# Patient Record
Sex: Female | Born: 1995 | Hispanic: Yes | Marital: Married | Smoking: Never smoker
Health system: Southern US, Community
[De-identification: ages and names within clinical notes are randomized; demographics above are authoritative.]

## PROBLEM LIST (undated history)

## (undated) ENCOUNTER — Inpatient Hospital Stay (HOSPITAL_COMMUNITY): Payer: Self-pay

## (undated) DIAGNOSIS — O139 Gestational [pregnancy-induced] hypertension without significant proteinuria, unspecified trimester: Secondary | ICD-10-CM

## (undated) DIAGNOSIS — D649 Anemia, unspecified: Secondary | ICD-10-CM

## (undated) DIAGNOSIS — Z5189 Encounter for other specified aftercare: Secondary | ICD-10-CM

## (undated) HISTORY — PX: SKIN GRAFT: SHX250

## (undated) HISTORY — PX: FRACTURE SURGERY: SHX138

---
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
## 2014-05-12 ENCOUNTER — Other Ambulatory Visit (HOSPITAL_COMMUNITY): Payer: Self-pay | Admitting: Nurse Practitioner

## 2014-05-12 DIAGNOSIS — Z3689 Encounter for other specified antenatal screening: Secondary | ICD-10-CM

## 2014-05-12 LAB — OB RESULTS CONSOLE HIV ANTIBODY (ROUTINE TESTING): HIV: NONREACTIVE

## 2014-05-12 LAB — OB RESULTS CONSOLE ABO/RH: RH Type: POSITIVE

## 2014-05-12 LAB — OB RESULTS CONSOLE HEPATITIS B SURFACE ANTIGEN: Hepatitis B Surface Ag: NEGATIVE

## 2014-05-12 LAB — OB RESULTS CONSOLE GC/CHLAMYDIA
Chlamydia: NEGATIVE
GC PROBE AMP, GENITAL: NEGATIVE

## 2014-05-12 LAB — OB RESULTS CONSOLE RUBELLA ANTIBODY, IGM: Rubella: IMMUNE

## 2014-05-12 LAB — OB RESULTS CONSOLE RPR: RPR: NONREACTIVE

## 2014-05-12 LAB — OB RESULTS CONSOLE ANTIBODY SCREEN: Antibody Screen: NEGATIVE

## 2014-06-16 ENCOUNTER — Ambulatory Visit (HOSPITAL_COMMUNITY)
Admission: RE | Admit: 2014-06-16 | Discharge: 2014-06-16 | Disposition: A | Discharge status: Home / Self Care | Payer: Medicaid Other | Source: Ambulatory Visit | Attending: Nurse Practitioner | Admitting: Nurse Practitioner

## 2014-06-16 ENCOUNTER — Other Ambulatory Visit (HOSPITAL_COMMUNITY): Payer: Self-pay | Admitting: Nurse Practitioner

## 2014-06-16 DIAGNOSIS — IMO0001 Reserved for inherently not codable concepts without codable children: Secondary | ICD-10-CM

## 2014-06-16 DIAGNOSIS — IMO0002 Reserved for concepts with insufficient information to code with codable children: Secondary | ICD-10-CM

## 2014-06-16 DIAGNOSIS — Z3689 Encounter for other specified antenatal screening: Secondary | ICD-10-CM

## 2014-06-16 DIAGNOSIS — O283 Abnormal ultrasonic finding on antenatal screening of mother: Secondary | ICD-10-CM

## 2014-07-28 ENCOUNTER — Encounter (HOSPITAL_COMMUNITY): Payer: Self-pay

## 2014-07-28 ENCOUNTER — Other Ambulatory Visit (HOSPITAL_COMMUNITY): Payer: Self-pay | Admitting: Physician Assistant

## 2014-07-28 ENCOUNTER — Ambulatory Visit (HOSPITAL_COMMUNITY)
Admission: RE | Admit: 2014-07-28 | Discharge: 2014-07-28 | Disposition: A | Discharge status: Home / Self Care | Payer: Medicaid Other | Source: Ambulatory Visit | Attending: Physician Assistant | Admitting: Physician Assistant

## 2014-07-28 ENCOUNTER — Ambulatory Visit (HOSPITAL_COMMUNITY)
Admission: RE | Admit: 2014-07-28 | Discharge: 2014-07-28 | Disposition: A | Discharge status: Home / Self Care | Payer: Medicaid Other | Source: Ambulatory Visit | Attending: Obstetrics and Gynecology | Admitting: Obstetrics and Gynecology

## 2014-07-28 VITALS — BP 124/68 | HR 99 | Wt 130.2 lb

## 2014-07-28 DIAGNOSIS — O283 Abnormal ultrasonic finding on antenatal screening of mother: Secondary | ICD-10-CM | POA: Insufficient documentation

## 2014-07-28 DIAGNOSIS — Z3A25 25 weeks gestation of pregnancy: Secondary | ICD-10-CM | POA: Insufficient documentation

## 2014-07-28 DIAGNOSIS — O10919 Unspecified pre-existing hypertension complicating pregnancy, unspecified trimester: Secondary | ICD-10-CM | POA: Insufficient documentation

## 2014-07-28 DIAGNOSIS — Z315 Encounter for genetic counseling: Secondary | ICD-10-CM | POA: Diagnosis not present

## 2014-07-28 DIAGNOSIS — O162 Unspecified maternal hypertension, second trimester: Secondary | ICD-10-CM | POA: Insufficient documentation

## 2014-07-28 DIAGNOSIS — O10912 Unspecified pre-existing hypertension complicating pregnancy, second trimester: Secondary | ICD-10-CM

## 2014-07-28 DIAGNOSIS — O358XX0 Maternal care for other (suspected) fetal abnormality and damage, not applicable or unspecified: Secondary | ICD-10-CM | POA: Insufficient documentation

## 2014-08-01 NOTE — Progress Notes (Signed)
Genetic Counseling  High-Risk Gestation Note  Appointment Date:  07/28/2014 Referred By: Quentin Mullingriplett, John J, PA-C Date of Birth:  03/07/1996 Partner:  Adan SisJose Villegas   Pregnancy History: [id number] Estimated Date of Delivery: 11/20/14 Estimated Gestational Age: 7328w4d Attending: Alpha GulaPaul Whitecar, MD   Ms. Chelsey Serfass and her partner, Mr. Adan SisJose Villegas, were seen for genetic counseling because of abnormal ultrasound findings.    Ms. Ervin KnackCardoza previously had ultrasound [facility] on 06/16/14.  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  Ultrasound report is under separate cover.  Follow-up ultrasound was performed today and the previously visualized markers were not appreciated at the time of today's exam. Complete ultrasound results reported separately.    We discussed that the second trimester genetic sonogram is targeted at identifying features associated with aneuploidy.  It has evolved as a screening tool used to provide an individualized risk assessment for Down syndrome and other trisomies.  The ability of sonography to aid in the detection of aneuploidies relies on identification of both major structural anomalies and "soft markers."  The patient was counseled that the latter term refers to findings that are often normal variants and do not cause any significant medical problems.  Nonetheless, these markers have a known association with aneuploidy.    We discussed that an echogenic bowel refers to an area in the fetal intestines that appears brighter, or denser, than the liver and/or bone on ultrasound. Echogenic bowel is detected in approximately 1% of fetuses at the time of a second trimester ultrasound evaluation.  Most of the time, this brightness does not cause any problems and will spontaneously resolve.  Possible causes of an echogenic bowel include undergrowth of the bowel, an obstruction or blockage of the intestines, the fetus  swallowing a small amount of blood present in the amniotic fluid, an intrauterine infection, a chromosome condition, cystic fibrosis, or a normal variation in development.   Ms. Ervin KnackCardoza reported no bleeding in the pregnancy. She also denied known exposure to infections during the pregnancy. Maternal TORCH titers were drawn at [facility] today and results are currently pending. We briefly reviewed cystic fibrosis (CF) including the typical features and prognosis and the autosomal recessive inheritance of the condition. The carrier frequency in the Hispanic population is approximately 1 in 10346.  The presence of echogenic bowel on prenatal ultrasound is associated with an increased risk for CF. Ms. Daiva NakayamaCardoza's OB medical records indicate that she previously had negative/normal CF carrier screening through her OB office. We reviewed that a normal screen reduces but does not eliminate the chance to be a carrier.  Thus, her risk to be a carrier has been reduced to approximately 1 in 203, and the risk for CF in the pregnancy has been significantly reduced.   We discussed chromosomes, nondisjunction, and examples of chromosome conditions including Down syndrome (trisomy 1821) and trisomy 6218. Ms. Ervin KnackCardoza previously had Quad screen performed through [facility], which was within normal range. We discussed that this screen reduced the risks for fetal Down syndrome (1 in 6,005), trisomy 18 (< 1 in 10,000), and open neural tube defects. They understand that this screen adjusts the a priori risk for these conditions to provide pregnancy specific risk assessment but is not diagnostic for these conditions. We discussed that the presence of echogenic bowel on ultrasound increases the chance for fetal Down syndrome from the patient's Quad screen result of 1 in 6,005 to approximately 1 in 1000.  The couple was counseled that the choroid plexus is an area in the brain where cerebral spinal fluid, the fluid that bathes  the brain and spinal cord, is made.  Cysts, or fluid filled sacs, are sometimes found in the choroid plexus of babies both before and after they are born.  We discussed that approximately 1% of pregnancies evaluated by ultrasound will show choroid plexus cyst (CPCs).  Literature suggests that CPCs are an ultrasound finding in approximately 30-50% of fetuses with trisomy 18, but are an isolated finding in less than 10% of fetuses with trisomy 74.  Ms. Mcguirt was counseled that when a patient has other risk factors for fetal trisomy 81 (abnormal First trimester or quad screening, advanced maternal age, or another ultrasound finding), CPCs are associated with an increased risk (LR of 9) for trisomy 22.  Newer literature suggests that in the absence of other risk factors, CPCs are likely a normal variation of development or a benign finding.  CPCs are not associated with an increased risk for fetal Down syndrome. Considering her maternal age of 18 y.o., her otherwise normal fetal anatomy ultrasound, and her normal Quad screen result, Ms. Heaton risk for fetal trisomy 57 is not expected to be increased above her screen adjusted risk.  However, additional testing options for detection of fetal trisomy 34 were discussed.    We reviewed other available screening and diagnostic options including noninvasive prenatal screening (NIPS)/cell free DNA (cfDNA) testing, and amniocentesis.  She was counseled regarding the benefits and limitations of each option. We discussed the methodology of NIPS, the conditions for which it screens, and the detection rates and false positive rates for these conditions. We reviewed the approximate 1 in 300-500 risk for complications for amniocentesis, including spontaneous pregnancy loss. We discussed that the risk for fetal aneuploidy is lower than the associated risk for complications from amniocentesis. After consideration of all the options, she declined additional screening and testing  for fetal aneuploidy (including declining both NIPS and amniocentesis).   Ms. Varsha Knock was provided with written information regarding sickle cell anemia (SCA) including the carrier frequency and incidence in the Hispanic population, the availability of carrier testing and prenatal diagnosis if indicated.  In addition, we discussed that hemoglobinopathies are routinely screened for as part of the [place] newborn screening panel.  She previously had screening through her OB on 05/12/14, which was reported to be normal.  Both family histories were reviewed and found to be contributory for intellectual disability. The patient reported two maternal first cousins once removed (her maternal grandmother's sister's children) with intellectual disability. These relatives are brother and sister to each other. Additionally, these cousins have another sister, who reportedly does not have intellectual disability but has one son with intellectual disability; the sister's other son and two daughters do not have intellectual disability. The patient had limited information regarding these relatives. She reported that the older relatives are able to work but do not live independently. The patient reported that the father of these cousins (who is reportedly not related to Ms. Ricke) is also mentally "slow." The underlying cause for their intellectual disability is not known.   Ms. Lovejoy was counseled that there are many different causes of intellectual disabilities including environmental, multifactorial, and genetic etiologies.  We discussed that a specific diagnosis for intellectual disability can be determined in approximately 50% of these individuals.  In the remaining 50% of individuals, a diagnosis may never be determined.  Regarding genetic causes, we discussed that chromosome  aberrations (aneuploidy, deletions, duplications, insertions, and translocations) are responsible for a small percentage of individuals with  intellectual disability.  Many individuals with chromosome aberrations have additional differences, including congenital anomalies or minor dysmorphisms.  Likewise, single gene conditions are the underlying cause of intellectual delay in some families.  We discussed that many gene conditions have intellectual disability as a feature, but also often include other physical or medical differences.  Specifically, we reviewed fragile X syndrome and the X-linked inheritance of this condition.  We discussed the option of FMR1 (the gene that when altered causes fragile X syndrome) analysis to determine whether Fragile X syndrome is the cause of intellectual disability in this family.  Ms. Ervin KnackCardoza declined FMR1 analysis today. We discussed that without more specific information, it is difficult to provide an accurate risk assessment.  Further genetic counseling is warranted if more information is obtained.   Additionally, Mr. Meda KlinefelterVillegas reported two double first cousins once removed with congenital abnormal cell growth. These individuals are both females and are first cousins to each other. The three year old reportedly has a growth on her lip which was present at birth, and the 76 months old reportedly has a growth on her leg. Limited information was known regarding the specific etiology for the growth or type of growth. We discussed that this description is not necessarily suggestive of a particular genetic syndrome. However, additional information is needed to accurately assess recurrence risk for relatives.  Without further information regarding the provided family history, an accurate genetic risk cannot be calculated. Further genetic counseling is warranted if more information is obtained.  Ms. Donzetta KohutSelene Slayden denied exposure to environmental toxins or chemical agents. She denied the use of alcohol, tobacco or street drugs. She denied significant viral illnesses during the course of her pregnancy. Her medical and  surgical histories were noncontributory.   I counseled this couple regarding the above risks and available options.  The approximate face-to-face time with the genetic counselor was 40 minutes.  Quinn PlowmanKaren Sakari Alkhatib, MS,  Certified Genetic Counselor 08/01/2014

## 2014-08-02 ENCOUNTER — Other Ambulatory Visit (HOSPITAL_COMMUNITY): Payer: Self-pay

## 2014-08-03 ENCOUNTER — Ambulatory Visit (HOSPITAL_COMMUNITY): Payer: Medicaid Other

## 2014-08-05 ENCOUNTER — Other Ambulatory Visit (HOSPITAL_COMMUNITY): Payer: Self-pay

## 2014-09-09 NOTE — L&D Delivery Note (Signed)
Delivery Note At 10:00 PM a viable female was delivered via Vaginal, Spontaneous Delivery (Presentation: ; Occiput Anterior).  APGAR: 9, 9; weight  . pending  Placenta status: Intact, Spontaneous.  Cord: 3 vessels with the following complications: None.  Cord pH: na  Anesthesia: None  Episiotomy: None Lacerations: None Suture Repair:  Est. Blood Loss (mL):  300  Mom to postpartum.  Baby to Couplet care / Skin to Skin. Uterus hypotonic given methergine 0.2 mg IM  Bethany Cruz,Bethany Cruz 11/23/2014, 10:18 PM

## 2014-09-12 ENCOUNTER — Ambulatory Visit (HOSPITAL_COMMUNITY): Admission: RE | Admit: 2014-09-12 | Payer: Medicaid Other | Source: Ambulatory Visit

## 2014-09-27 ENCOUNTER — Other Ambulatory Visit (HOSPITAL_COMMUNITY): Payer: Self-pay

## 2014-09-27 ENCOUNTER — Encounter (HOSPITAL_COMMUNITY): Payer: Self-pay

## 2014-09-27 ENCOUNTER — Ambulatory Visit (HOSPITAL_COMMUNITY)
Admission: RE | Admit: 2014-09-27 | Discharge: 2014-09-27 | Disposition: A | Discharge status: Home / Self Care | Payer: Medicaid Other | Source: Ambulatory Visit | Attending: Physician Assistant | Admitting: Physician Assistant

## 2014-09-27 DIAGNOSIS — O10912 Unspecified pre-existing hypertension complicating pregnancy, second trimester: Secondary | ICD-10-CM

## 2014-09-27 DIAGNOSIS — Z3A32 32 weeks gestation of pregnancy: Secondary | ICD-10-CM | POA: Insufficient documentation

## 2014-09-27 DIAGNOSIS — O163 Unspecified maternal hypertension, third trimester: Secondary | ICD-10-CM | POA: Diagnosis present

## 2014-09-27 DIAGNOSIS — O350XX0 Maternal care for (suspected) central nervous system malformation in fetus, not applicable or unspecified: Secondary | ICD-10-CM | POA: Insufficient documentation

## 2014-09-27 DIAGNOSIS — O283 Abnormal ultrasonic finding on antenatal screening of mother: Secondary | ICD-10-CM

## 2014-09-27 DIAGNOSIS — O10913 Unspecified pre-existing hypertension complicating pregnancy, third trimester: Secondary | ICD-10-CM | POA: Insufficient documentation

## 2014-09-27 DIAGNOSIS — O3503X0 Maternal care for (suspected) central nervous system malformation or damage in fetus, choroid plexus cysts, not applicable or unspecified: Secondary | ICD-10-CM | POA: Insufficient documentation

## 2014-10-28 LAB — OB RESULTS CONSOLE GBS: GBS: POSITIVE

## 2014-11-03 ENCOUNTER — Other Ambulatory Visit (HOSPITAL_COMMUNITY): Payer: Self-pay | Admitting: Urology

## 2014-11-03 DIAGNOSIS — IMO0002 Reserved for concepts with insufficient information to code with codable children: Secondary | ICD-10-CM

## 2014-11-04 ENCOUNTER — Ambulatory Visit (HOSPITAL_COMMUNITY)
Admission: RE | Admit: 2014-11-04 | Discharge: 2014-11-04 | Disposition: A | Discharge status: Home / Self Care | Payer: Medicaid Other | Source: Ambulatory Visit | Attending: Urology | Admitting: Urology

## 2014-11-04 ENCOUNTER — Ambulatory Visit (HOSPITAL_COMMUNITY): Admission: RE | Admit: 2014-11-04 | Payer: Medicaid Other | Source: Ambulatory Visit

## 2014-11-04 DIAGNOSIS — Z36 Encounter for antenatal screening of mother: Secondary | ICD-10-CM | POA: Diagnosis not present

## 2014-11-04 DIAGNOSIS — O10013 Pre-existing essential hypertension complicating pregnancy, third trimester: Secondary | ICD-10-CM | POA: Insufficient documentation

## 2014-11-04 DIAGNOSIS — Z3A37 37 weeks gestation of pregnancy: Secondary | ICD-10-CM | POA: Diagnosis not present

## 2014-11-04 DIAGNOSIS — O10919 Unspecified pre-existing hypertension complicating pregnancy, unspecified trimester: Secondary | ICD-10-CM | POA: Insufficient documentation

## 2014-11-04 DIAGNOSIS — IMO0002 Reserved for concepts with insufficient information to code with codable children: Secondary | ICD-10-CM

## 2014-11-21 ENCOUNTER — Other Ambulatory Visit (HOSPITAL_COMMUNITY): Payer: Self-pay | Admitting: Physician Assistant

## 2014-11-21 DIAGNOSIS — Z3A40 40 weeks gestation of pregnancy: Secondary | ICD-10-CM

## 2014-11-21 DIAGNOSIS — O10013 Pre-existing essential hypertension complicating pregnancy, third trimester: Secondary | ICD-10-CM

## 2014-11-23 ENCOUNTER — Encounter (HOSPITAL_COMMUNITY): Payer: Self-pay

## 2014-11-23 ENCOUNTER — Encounter (HOSPITAL_COMMUNITY): Payer: Self-pay | Admitting: *Deleted

## 2014-11-23 ENCOUNTER — Telehealth (HOSPITAL_COMMUNITY): Payer: Self-pay | Admitting: *Deleted

## 2014-11-23 ENCOUNTER — Inpatient Hospital Stay (HOSPITAL_COMMUNITY)
Admission: AD | Admit: 2014-11-23 | Discharge: 2014-11-25 | DRG: 775 | Disposition: A | Discharge status: Home / Self Care | Payer: Medicaid Other | Source: Ambulatory Visit | Attending: Obstetrics & Gynecology | Admitting: Obstetrics & Gynecology

## 2014-11-23 DIAGNOSIS — Z3A40 40 weeks gestation of pregnancy: Secondary | ICD-10-CM | POA: Diagnosis present

## 2014-11-23 DIAGNOSIS — O99824 Streptococcus B carrier state complicating childbirth: Secondary | ICD-10-CM | POA: Diagnosis present

## 2014-11-23 DIAGNOSIS — Z3483 Encounter for supervision of other normal pregnancy, third trimester: Secondary | ICD-10-CM | POA: Diagnosis present

## 2014-11-23 DIAGNOSIS — O480 Post-term pregnancy: Principal | ICD-10-CM | POA: Diagnosis present

## 2014-11-23 HISTORY — DX: Gestational (pregnancy-induced) hypertension without significant proteinuria, unspecified trimester: O13.9

## 2014-11-23 LAB — COMPREHENSIVE METABOLIC PANEL
ALBUMIN: 3.3 g/dL — AB (ref 3.5–5.2)
ALK PHOS: 185 U/L — AB (ref 39–117)
ALT: 17 U/L (ref 0–35)
AST: 26 U/L (ref 0–37)
Anion gap: 7 (ref 5–15)
BUN: 10 mg/dL (ref 6–23)
CO2: 24 mmol/L (ref 19–32)
Calcium: 9 mg/dL (ref 8.4–10.5)
Chloride: 106 mmol/L (ref 96–112)
Creatinine, Ser: 0.55 mg/dL (ref 0.50–1.10)
GFR calc Af Amer: >90 mL/min (ref >90)
GFR calc non Af Amer: >90 mL/min (ref >90)
Glucose, Bld: 89 mg/dL (ref 70–99)
Potassium: 3.8 mmol/L (ref 3.5–5.1)
Sodium: 137 mmol/L (ref 135–145)
Total Bilirubin: 0.8 mg/dL (ref 0.3–1.2)
Total Protein: 7.1 g/dL (ref 6.0–8.3)

## 2014-11-23 LAB — CBC
HEMATOCRIT: 34.9 % — AB (ref 36.0–46.0)
HEMOGLOBIN: 11.8 g/dL — AB (ref 12.0–15.0)
MCH: 31 pg (ref 26.0–34.0)
MCHC: 33.8 g/dL (ref 30.0–36.0)
MCV: 91.6 fL (ref 78.0–100.0)
Platelets: 139 10*3/uL — ABNORMAL LOW (ref 150–400)
RBC: 3.81 MIL/uL — ABNORMAL LOW (ref 3.87–5.11)
RDW: 13.4 % (ref 11.5–15.5)
WBC: 10.2 10*3/uL (ref 4.0–10.5)

## 2014-11-23 LAB — TYPE AND SCREEN
ABO/RH(D): O POS
ANTIBODY SCREEN: NEGATIVE

## 2014-11-23 MED ORDER — LIDOCAINE HCL (PF) 1 % IJ SOLN
30.0000 mL | INTRAMUSCULAR | Status: DC | PRN
  Filled 2014-11-23: qty 30

## 2014-11-23 MED ORDER — FENTANYL 2.5 MCG/ML BUPIVACAINE 1/10 % EPIDURAL INFUSION (WH - ANES)
INTRAMUSCULAR | Status: AC
  Filled 2014-11-23: qty 125

## 2014-11-23 MED ORDER — LACTATED RINGERS IV SOLN: 500.0000 mL | INTRAVENOUS | Status: DC | PRN

## 2014-11-23 MED ORDER — SODIUM CHLORIDE 0.9 % IV SOLN
2.0000 g | Freq: Once | INTRAVENOUS | Status: AC
  Administered 2014-11-23: 2 g via INTRAVENOUS
  Filled 2014-11-23: qty 2000

## 2014-11-23 MED ORDER — OXYTOCIN 40 UNITS IN LACTATED RINGERS INFUSION - SIMPLE MED
62.5000 mL/h | INTRAVENOUS | Status: DC
  Administered 2014-11-23: 62.5 mL/h via INTRAVENOUS
  Filled 2014-11-23: qty 1000

## 2014-11-23 MED ORDER — DEXTROSE 5 % IV SOLN
2.5000 10*6.[IU] | INTRAVENOUS | Status: DC
  Filled 2014-11-23 (×2): qty 2.5

## 2014-11-23 MED ORDER — OXYCODONE-ACETAMINOPHEN 5-325 MG PO TABS: 1.0000 | ORAL_TABLET | ORAL | Status: DC | PRN

## 2014-11-23 MED ORDER — OXYCODONE-ACETAMINOPHEN 5-325 MG PO TABS: 2.0000 | ORAL_TABLET | ORAL | Status: DC | PRN

## 2014-11-23 MED ORDER — ONDANSETRON HCL 4 MG/2ML IJ SOLN: 4.0000 mg | Freq: Four times a day (QID) | INTRAMUSCULAR | Status: DC | PRN

## 2014-11-23 MED ORDER — LACTATED RINGERS IV SOLN
INTRAVENOUS | Status: DC
  Administered 2014-11-23: 21:00:00 via INTRAVENOUS

## 2014-11-23 MED ORDER — CITRIC ACID-SODIUM CITRATE 334-500 MG/5ML PO SOLN: 30.0000 mL | ORAL | Status: DC | PRN

## 2014-11-23 MED ORDER — PENICILLIN G POTASSIUM 5000000 UNITS IJ SOLR
5.0000 10*6.[IU] | Freq: Once | INTRAVENOUS | Status: DC
  Filled 2014-11-23: qty 5

## 2014-11-23 MED ORDER — METHYLERGONOVINE MALEATE 0.2 MG/ML IJ SOLN
INTRAMUSCULAR | Status: AC
  Administered 2014-11-23: 22:00:00
  Filled 2014-11-23: qty 1

## 2014-11-23 MED ORDER — ACETAMINOPHEN 325 MG PO TABS: 650.0000 mg | ORAL_TABLET | ORAL | Status: DC | PRN

## 2014-11-23 MED ORDER — FLEET ENEMA 7-19 GM/118ML RE ENEM: 1.0000 | ENEMA | RECTAL | Status: DC | PRN

## 2014-11-23 MED ORDER — PHENYLEPHRINE 40 MCG/ML (10ML) SYRINGE FOR IV PUSH (FOR BLOOD PRESSURE SUPPORT)
PREFILLED_SYRINGE | INTRAVENOUS | Status: AC
  Filled 2014-11-23: qty 20

## 2014-11-23 MED ORDER — OXYTOCIN BOLUS FROM INFUSION: 500.0000 mL | INTRAVENOUS | Status: DC

## 2014-11-23 MED ORDER — FENTANYL CITRATE 0.05 MG/ML IJ SOLN
50.0000 ug | INTRAMUSCULAR | Status: DC | PRN
  Administered 2014-11-23: 100 ug via INTRAVENOUS
  Filled 2014-11-23: qty 2

## 2014-11-23 NOTE — Telephone Encounter (Signed)
Preadmission screen  

## 2014-11-23 NOTE — H&P (Signed)
Bethany KohutSelene Cruz is a 19 y.o. female presenting for active labor.    Maternal Medical History:  Reason for admission: Nausea.     Patient is 19 y.o. G2P1001 7942w3d, HD patient, GBS positive, here with complaints of contractions occuring every 3-8 minutes, beginning this morning.  She acknowledges good FM, denies LOF, VB, vaginal discharge. Previous preg with h/o PIH, thrombocytopenia.  Some elevated BP this pregnancy,but no diagnosis of gHTN per HD notes.   OB History    Gravida Para Term Preterm AB TAB SAB Ectopic Multiple Living   2 1 1  0 0 0 0 0 0 1     Past Medical History  Diagnosis Date  . Pregnancy induced hypertension    Past Surgical History  Procedure Laterality Date  . Fracture surgery      elbow & pelvis   Family History: family history is not on file. Social History:  reports that she has never smoked. She has never used smokeless tobacco. She reports that she does not drink alcohol or use illicit drugs.   Prenatal Transfer Tool  Maternal Diabetes: No Genetic Screening: Normal Maternal Ultrasounds/Referrals: Normal Fetal Ultrasounds or other Referrals:  None Maternal Substance Abuse:  No Significant Maternal Medications:  None Significant Maternal Lab Results:  None Other Comments:  None  Review of Systems  Constitutional: Negative for fever and chills.  Eyes: Negative for blurred vision.  Respiratory: Negative for cough and shortness of breath.   Cardiovascular: Negative for leg swelling.  Gastrointestinal: Negative for nausea, vomiting, abdominal pain and diarrhea.  Genitourinary: Negative for dysuria and hematuria.  Neurological: Negative for headaches.    Dilation: 4.5 Effacement (%): 70 Station: -3 Exam by:: E.Lawrence, RN Blood pressure 121/81, pulse 91, height 5' 5.5" (1.664 m), weight 68.947 kg (152 lb), last menstrual period 02/03/2014, not currently breastfeeding. Exam Physical Exam  Constitutional: She appears well-developed and  well-nourished. No distress.  HENT:  Head: Normocephalic and atraumatic.  Eyes: Conjunctivae are normal. Pupils are equal, round, and reactive to light.  Neck: Normal range of motion.  Cardiovascular: Normal rate.   Respiratory: Effort normal. No respiratory distress.  GI: Soft. She exhibits no distension. There is no tenderness.  Musculoskeletal: Normal range of motion. She exhibits no edema.  Neurological: She is alert. Coordination normal.  Skin: Skin is warm and dry. She is not diaphoretic.    Prenatal labs: ABO, Rh: O/Positive/-- (09/03 0000) Antibody: Negative (09/03 0000) Rubella: Immune (09/03 0000) RPR: Nonreactive (09/03 0000)  HBsAg: Negative (09/03 0000)  HIV: Non-reactive (09/03 0000)  GBS: Positive (02/19 0000)   Assessment/Plan: A: Patient is 19 y.o. G2P1001 5542w3d, HD patient, GBS positive, here with SOL.   P: Spontaneous labor, progressing normally     Expectant management.   Contraception: nexplanon Feeding: breast Sex/circ:  Girl  Cruz,Bethany 11/23/2014, 9:13 PM  CNM attestation:  I have seen and examined this patient; I agree with above documentation in the resident's note.   Bethany Cruz is a 19 y.o. [id number] here for spontaneous labor.  PE: BP 121/72 mmHg  Pulse 92  Temp(Src) 98.4 F (36.9 C) (Oral)  Resp 18  Ht 5' 5.5" (1.664 m)  Wt 68.947 kg (152 lb)  BMI 24.90 kg/m2  SpO2 100%  LMP 02/03/2014  Breastfeeding? Unknown Gen: calm comfortable, NAD Resp: normal effort, no distress Abd: gravid  ROS, labs, PMH reviewed  Plan: PCN for GBS prophylaxis Expectant management Anticipate SVD  SHAW, KIMBERLY CNM 11/24/2014, 1:14 AM

## 2014-11-24 ENCOUNTER — Ambulatory Visit (HOSPITAL_COMMUNITY): Payer: Medicaid Other

## 2014-11-24 ENCOUNTER — Ambulatory Visit (HOSPITAL_COMMUNITY): Admission: RE | Admit: 2014-11-24 | Payer: Medicaid Other | Source: Ambulatory Visit

## 2014-11-24 ENCOUNTER — Encounter (HOSPITAL_COMMUNITY): Payer: Self-pay

## 2014-11-24 LAB — PROTEIN / CREATININE RATIO, URINE
Creatinine, Urine: 30 mg/dL
PROTEIN CREATININE RATIO: 1.17 — AB (ref 0.00–0.15)
TOTAL PROTEIN, URINE: 35 mg/dL

## 2014-11-24 LAB — CBC
HCT: 31.5 % — ABNORMAL LOW (ref 36.0–46.0)
Hemoglobin: 10.7 g/dL — ABNORMAL LOW (ref 12.0–15.0)
MCH: 30.8 pg (ref 26.0–34.0)
MCHC: 34 g/dL (ref 30.0–36.0)
MCV: 90.8 fL (ref 78.0–100.0)
PLATELETS: 147 10*3/uL — AB (ref 150–400)
RBC: 3.47 MIL/uL — ABNORMAL LOW (ref 3.87–5.11)
RDW: 13.3 % (ref 11.5–15.5)
WBC: 15.9 10*3/uL — AB (ref 4.0–10.5)

## 2014-11-24 LAB — RPR: RPR Ser Ql: NONREACTIVE

## 2014-11-24 LAB — ABO/RH: ABO/RH(D): O POS

## 2014-11-24 MED ORDER — OXYCODONE-ACETAMINOPHEN 5-325 MG PO TABS
1.0000 | ORAL_TABLET | ORAL | Status: DC | PRN
Start: 2014-11-24 — End: 2014-11-25

## 2014-11-24 MED ORDER — WITCH HAZEL-GLYCERIN EX PADS: 1.0000 "application " | MEDICATED_PAD | CUTANEOUS | Status: DC | PRN

## 2014-11-24 MED ORDER — BENZOCAINE-MENTHOL 20-0.5 % EX AERO: 1.0000 "application " | INHALATION_SPRAY | CUTANEOUS | Status: DC | PRN

## 2014-11-24 MED ORDER — DIBUCAINE 1 % RE OINT: 1.0000 "application " | TOPICAL_OINTMENT | RECTAL | Status: DC | PRN

## 2014-11-24 MED ORDER — SENNOSIDES-DOCUSATE SODIUM 8.6-50 MG PO TABS
2.0000 | ORAL_TABLET | ORAL | Status: DC
  Filled 2014-11-24: qty 2

## 2014-11-24 MED ORDER — LIDOCAINE HCL 1 % IJ SOLN
0.0000 mL | Freq: Once | INTRAMUSCULAR | Status: AC | PRN
  Filled 2014-11-24: qty 20

## 2014-11-24 MED ORDER — SIMETHICONE 80 MG PO CHEW: 80.0000 mg | CHEWABLE_TABLET | ORAL | Status: DC | PRN

## 2014-11-24 MED ORDER — METHYLERGONOVINE MALEATE 0.2 MG PO TABS
0.2000 mg | ORAL_TABLET | Freq: Four times a day (QID) | ORAL | Status: DC
  Administered 2014-11-24 – 2014-11-25 (×6): 0.2 mg via ORAL
  Filled 2014-11-24 (×6): qty 1

## 2014-11-24 MED ORDER — PRENATAL MULTIVITAMIN CH
1.0000 | ORAL_TABLET | Freq: Every day | ORAL | Status: DC
Start: 2014-11-24 — End: 2014-11-25
  Administered 2014-11-24: 1 via ORAL
  Filled 2014-11-24 (×2): qty 1

## 2014-11-24 MED ORDER — ETONOGESTREL 68 MG ~~LOC~~ IMPL
68.0000 mg | DRUG_IMPLANT | Freq: Once | SUBCUTANEOUS | Status: DC
  Filled 2014-11-24: qty 1

## 2014-11-24 MED ORDER — IBUPROFEN 600 MG PO TABS
600.0000 mg | ORAL_TABLET | Freq: Four times a day (QID) | ORAL | Status: DC
  Administered 2014-11-24 – 2014-11-25 (×5): 600 mg via ORAL
  Filled 2014-11-24 (×6): qty 1

## 2014-11-24 MED ORDER — ONDANSETRON HCL 4 MG PO TABS: 4.0000 mg | ORAL_TABLET | ORAL | Status: DC | PRN

## 2014-11-24 MED ORDER — METHYLERGONOVINE MALEATE 0.2 MG PO TABS: 0.2000 mg | ORAL_TABLET | Freq: Four times a day (QID) | ORAL | Status: DC

## 2014-11-24 MED ORDER — TETANUS-DIPHTH-ACELL PERTUSSIS 5-2.5-18.5 LF-MCG/0.5 IM SUSP: 0.5000 mL | Freq: Once | INTRAMUSCULAR | Status: DC

## 2014-11-24 MED ORDER — LANOLIN HYDROUS EX OINT: TOPICAL_OINTMENT | CUTANEOUS | Status: DC | PRN

## 2014-11-24 MED ORDER — DIPHENHYDRAMINE HCL 25 MG PO CAPS: 25.0000 mg | ORAL_CAPSULE | Freq: Four times a day (QID) | ORAL | Status: DC | PRN

## 2014-11-24 MED ORDER — OXYCODONE-ACETAMINOPHEN 5-325 MG PO TABS
2.0000 | ORAL_TABLET | ORAL | Status: DC | PRN
Start: 2014-11-24 — End: 2014-11-25

## 2014-11-24 MED ORDER — ACETAMINOPHEN 325 MG PO TABS: 650.0000 mg | ORAL_TABLET | ORAL | Status: DC | PRN

## 2014-11-24 MED ORDER — ONDANSETRON HCL 4 MG/2ML IJ SOLN: 4.0000 mg | INTRAMUSCULAR | Status: DC | PRN

## 2014-11-24 MED ORDER — ZOLPIDEM TARTRATE 5 MG PO TABS: 5.0000 mg | ORAL_TABLET | Freq: Every evening | ORAL | Status: DC | PRN

## 2014-11-24 NOTE — Lactation Note (Addendum)
This note was copied from the chart of Bethany Berlin Vacca. Lactation Consultation Note; Initial visit with mom. She reports that baby just finished feeding for 10 min. Reports she has been latching well with no pain. LS 8 by RN. BF brochure given with resources for support after DC. Discussed cluster feeding and the second night. No questions at present. To call for assist prn  Patient Name: Bethany Donzetta KohutSelene Cruz [id number]'s Date: 11/24/2014 Reason for consult: Initial assessment   Maternal Data Formula Feeding for Exclusion: No Does the patient have breastfeeding experience prior to this delivery?: No  Feeding   LATCH Score/Interventions                      Lactation Tools Discussed/Used     Consult Status Consult Status: Follow-up Date: 11/25/14 Follow-up type: In-patient    Bethany Cruz, Bethany Cruz 11/24/2014, 11:23 AM

## 2014-11-24 NOTE — Progress Notes (Signed)
UR chart review completed.  

## 2014-11-24 NOTE — Progress Notes (Signed)
Post Partum Day #1  Subjective: no complaints, up ad lib, voiding and tolerating PO  Objective: Blood pressure 131/79, pulse 83, temperature 98.3 F (36.8 C), temperature source Oral, resp. rate 18, height 5' 5.5" (1.664 m), weight 68.947 kg (152 lb), last menstrual period 02/03/2014, SpO2 100 %, unknown if currently breastfeeding.  Physical Exam:  General: alert, cooperative and no distress Lochia: appropriate Uterine Fundus: firm Incision: None DVT Evaluation: No evidence of DVT seen on physical exam.   Recent Labs  11/23/14 2120 11/24/14 0535  HGB 11.8* 10.7*  HCT 34.9* 31.5*    Assessment/Plan: Plan for discharge tomorrow   Feeding: Breast Sex/circ: Female MOC: Nexplanon   LOS: 1 day   Henson,Amber 11/24/2014, 7:13 AM   I have seen and examined this patient and I agree with the above. Cam HaiSHAW, KIMBERLY CNM 9:02 AM 11/24/2014

## 2014-11-25 ENCOUNTER — Encounter (HOSPITAL_COMMUNITY): Payer: Self-pay | Admitting: *Deleted

## 2014-11-25 MED ORDER — ACETAMINOPHEN 325 MG PO TABS: 650.0000 mg | ORAL_TABLET | ORAL | Status: DC | PRN

## 2014-11-25 MED ORDER — IBUPROFEN 600 MG PO TABS: 600.0000 mg | ORAL_TABLET | Freq: Four times a day (QID) | ORAL | Status: DC

## 2014-11-25 NOTE — Lactation Note (Signed)
This note was copied from the chart of Bethany Cruz. Lactation Consultation Note  Patient Name: Bethany Donzetta KohutSelene Cruz [id number]'s Date: 11/25/2014  Bethany LeisureBaby is 336 hours old and has been to the breast consistently , 4% weight loss, Voids and stools adequate for age ( 4 wets , 4 stools ) , Latch scores 8-9 , and  feeding range at the breast  10 -20 mins. Serum Bili at 32 hours = 7.1. Per mom breast are feeling heavier and fuller and hearing more swallows. Per mom nipples tender , no breakdown, LC encouraged mom to continue using EBM on nipples LC reviewed sore nipple and engorgement prevention and tx , referring to Baby and me booklet pages 24 - 25. Per mom will be F/U with [facility]. LC mentioned the office probably will set her up with apt . With  [employer] IB[employer]. Also informed mom of the LC O/P services, BFSG , and Warm BF phone line at [facility] if needed. Mom receptive to review and has a pump at home if needed, usually hand expresses.     Maternal Data Has patient been taught Hand Expression?: Yes  Feeding Feeding Type: Breast Fed Length of feed: 10 min (per mom )  LATCH Score/Interventions                Intervention(s): Breastfeeding basics reviewed     Lactation Tools Discussed/Used Pump Review: Milk Storage Initiated by:: MAI  Date initiated:: 11/25/14   Consult Status Consult Status: Complete Date: 11/25/14    Bethany Cruz, [name] 11/25/2014, 10:47 AM

## 2014-11-25 NOTE — Discharge Summary (Signed)
Obstetric Discharge Summary Reason for Admission: onset of labor Prenatal Procedures: none Intrapartum Procedures: spontaneous vaginal delivery Postpartum Procedures: none Complications-Operative and Postpartum: none   Patient is 19 y.o. G2P1001 6071w3d, HD patient, GBS positive, admitted for SOL.  Inadequate GBS tx prior to delivery (Ampicillin x 1).  At 10:00 PM on 11/23/2014, a viable female was delivered via Vaginal, Spontaneous Delivery. Placenta status: Intact, Spontaneous. Cord: 3 vessels. Anesthesia: None  Episiotomy: None Lacerations: None Mom to postpartum. Baby to Couplet care / Skin to Skin. Uterus hypotonic given methergine 0.2 mg IM  HEMOGLOBIN  Date Value Ref Range Status  11/24/2014 10.7* 12.0 - 15.0 g/dL Final   HCT  Date Value Ref Range Status  11/24/2014 31.5* 36.0 - 46.0 % Final    Physical Exam:  General: alert Lochia: appropriate Uterine Fundus: firm Incision: none DVT Evaluation: No evidence of DVT seen on physical exam.  Discharge Diagnoses: Term Pregnancy-delivered  Discharge Information: Date: 11/25/2014 Activity: pelvic rest Diet: routine Medications: Ibuprofen Condition: stable Instructions: refer to practice specific booklet Discharge to: home Follow-up Information    [facility] Schedule an appointment as soon as possible for a visit in 4 weeks.   Specialty:  Home Health Services   Contact information:   Care Coordination for [facility] [address] [place] [address] [phone]       Newborn Data: Live born female  Birth Weight: 6 lb 8.1 oz (2950 g) APGAR: 9, 9 Home with mother  Feeding: breast Contraception: Nexplanon originally planned inpatient, discussed extensively, pt remains fearful, prefers to defer placement.   I have seen and examined this patient and agree the above assessment. CRESENZO-DISHMAN,FRANCES 11/25/2014 7:57  AM    CRESENZO-DISHMAN,FRANCES 11/25/2014, 7:57 AM

## 2014-11-30 ENCOUNTER — Inpatient Hospital Stay (HOSPITAL_COMMUNITY): Admission: RE | Admit: 2014-11-30 | Payer: Medicaid Other | Source: Ambulatory Visit

## 2017-09-09 NOTE — L&D Delivery Note (Signed)
Patient is a 22 y.o. now G3P3 s/p NSVD at 8265w0d, who [facility]  She progressed with augmentation(FB, Cytotec, Pitocin) to complete and pushed 12 minutes to deliver.  Cord clamping delayed by several minutes then clamped by CNM and cut by FOB.  Placenta intact and spontaneous, bleeding moderate.  1st degree laceration identified- not repaired, hemostatic.  Mom and baby stable prior to transfer to postpartum. She plans on breastfeeding. She is unsure method for birth control.  Delivery Note At 3:42 AM a viable and healthy female was delivered via Vaginal, Spontaneous (Presentation: ROA).  APGAR: 9, 9; weight pending .   Placenta intact and spontaneous, bleeding moderate. 800mcg Cytotec PP given. 3V Cord.     Anesthesia: Epidural Episiotomy: None Lacerations:  1st degree perineal  Suture Repair: None Est. Blood Loss (mL): 300  Mom to postpartum.  Baby to Couplet care / Skin to Skin.  Sharyon CableVeronica C Velina Drollinger CNM 04/13/2018, 4:53 AM

## 2017-11-29 ENCOUNTER — Inpatient Hospital Stay (HOSPITAL_COMMUNITY)
Admission: AD | Admit: 2017-11-29 | Discharge: 2017-11-29 | Disposition: A | Discharge status: Home / Self Care | Payer: Medicaid Other | Source: Ambulatory Visit | Attending: Obstetrics and Gynecology | Admitting: Obstetrics and Gynecology

## 2017-11-29 ENCOUNTER — Encounter (HOSPITAL_COMMUNITY): Payer: Self-pay | Admitting: *Deleted

## 2017-11-29 DIAGNOSIS — Z79899 Other long term (current) drug therapy: Secondary | ICD-10-CM | POA: Diagnosis not present

## 2017-11-29 DIAGNOSIS — Z3A19 19 weeks gestation of pregnancy: Secondary | ICD-10-CM

## 2017-11-29 DIAGNOSIS — O368120 Decreased fetal movements, second trimester, not applicable or unspecified: Secondary | ICD-10-CM | POA: Insufficient documentation

## 2017-11-29 DIAGNOSIS — Z711 Person with feared health complaint in whom no diagnosis is made: Secondary | ICD-10-CM

## 2017-11-29 HISTORY — DX: Anemia, unspecified: D64.9

## 2017-11-29 NOTE — MAU Provider Note (Signed)
  History     CSN: [id number]  Arrival date and time: 11/29/17 0200   First Provider Initiated Contact with Patient 11/29/17 0216      Chief Complaint  Patient presents with  . Decreased Fetal Movement   HPI  Bethany Cruz is a 22 y.o. G3P2002 at 7390w5d who presents to MAU today with complaint of decreased fetal movement since this morning. She had a confirmatory US at the pregnancy care center, but hasn't started prenatal care. [facility]   OB History    Gravida  3   Para  2   Term  2   Preterm  0   AB  0   Living  2     SAB  0   TAB  0   Ectopic  0   Multiple  0   Live Births  2           Past Medical History:  Diagnosis Date  . Pregnancy induced hypertension     Past Surgical History:  Procedure Laterality Date  . FRACTURE SURGERY     elbow & pelvis    No family history on file.  Social History   Tobacco Use  . Smoking status: Never Smoker  . Smokeless tobacco: Never Used  Substance Use Topics  . Alcohol use: No  . Drug use: No    Allergies: No Known Allergies  Medications Prior to Admission  Medication Sig Dispense Refill Last Dose  . acetaminophen (TYLENOL) 325 MG tablet Take 2 tablets (650 mg total) by mouth every 4 (four) hours as needed for mild pain (for pain scale < 4). 30 tablet 0   . ibuprofen (ADVIL,MOTRIN) 600 MG tablet Take 1 tablet (600 mg total) by mouth every 6 (six) hours. 30 tablet 0   . Pediatric Multiple Vit-C-FA (FLINSTONES GUMMIES OMEGA-3 DHA) CHEW Chew 2 tablets by mouth daily.   Past Week at Unknown time    Review of Systems  Constitutional: Negative for fever.  Gastrointestinal: Negative for abdominal pain.  Genitourinary: Negative for vaginal bleeding and vaginal discharge.   Physical Exam   Blood pressure 127/73, pulse 89, temperature 98.8 F (37.1 C), temperature source Oral, resp. rate 18, height 5' 5.5" (1.664 m), weight 143 lb (64.9 kg), unknown if currently  breastfeeding.  Physical Exam  Nursing note and vitals reviewed. Constitutional: She is oriented to person, place, and time. She appears well-developed and well-nourished. No distress.  HENT:  Head: Normocephalic and atraumatic.  Cardiovascular: Normal rate.  Respiratory: Effort normal.  GI: Soft. She exhibits no distension and no mass. There is no tenderness. There is no rebound and no guarding.  Fundus at umbilicus  Neurological: She is alert and oriented to person, place, and time.  Skin: Skin is warm and dry. No erythema.  Psychiatric: She has a normal mood and affect.    MAU Course  Procedures None  MDM FHR - 156 bpm with doppler Patient reassured of normal FM expected for GA  Assessment and Plan  A: SIUP at 2390w5d Physically well, but worried   P:  Discharge home Second trimester precautions discussed Patient advised to follow-up with [facility] as scheduled this week to start prenatal care Continue prenatal vitamins daily  Patient may return to MAU as needed or if her condition were to change or worsen  Vonzella NippleJulie Bellagrace Sylvan, PA-C 11/29/2017, 2:16 AM

## 2017-11-29 NOTE — Discharge Instructions (Signed)
Second Trimester of Pregnancy The second trimester is from week 13 through week 28, month 4 through 6. This is often the time in pregnancy that you feel your best. Often times, morning sickness has lessened or quit. You may have more energy, and you may get hungry more often. Your unborn baby (fetus) is growing rapidly. At the end of the sixth month, he or she is about 9 inches long and weighs about 1 pounds. You will likely feel the baby move (quickening) between 18 and 20 weeks of pregnancy. Follow these instructions at home:  Avoid all smoking, herbs, and alcohol. Avoid drugs not approved by your doctor.  Do not use any tobacco products, including cigarettes, chewing tobacco, and electronic cigarettes. If you need help quitting, ask your doctor. You may get counseling or other support to help you quit.  Only take medicine as told by your doctor. Some medicines are safe and some are not during pregnancy.  Exercise only as told by your doctor. Stop exercising if you start having cramps.  Eat regular, healthy meals.  Wear a good support bra if your breasts are tender.  Do not use hot tubs, steam rooms, or saunas.  Wear your seat belt when driving.  Avoid raw meat, uncooked cheese, and liter boxes and soil used by cats.  Take your prenatal vitamins.  Take 1500-2000 milligrams of calcium daily starting at the 20th week of pregnancy until you deliver your baby.  Try taking medicine that helps you poop (stool softener) as needed, and if your doctor approves. Eat more fiber by eating fresh fruit, vegetables, and whole grains. Drink enough fluids to keep your pee (urine) clear or pale yellow.  Take warm water baths (sitz baths) to soothe pain or discomfort caused by hemorrhoids. Use hemorrhoid cream if your doctor approves.  If you have puffy, bulging veins (varicose veins), wear support hose. Raise (elevate) your feet for 15 minutes, 3-4 times a day. Limit salt in your diet.  Avoid heavy  lifting, wear low heals, and sit up straight.  Rest with your legs raised if you have leg cramps or low back pain.  Visit your dentist if you have not gone during your pregnancy. Use a soft toothbrush to brush your teeth. Be gentle when you floss.  You can have sex (intercourse) unless your doctor tells you not to.  Go to your doctor visits. Get help if:  You feel dizzy.  You have mild cramps or pressure in your lower belly (abdomen).  You have a nagging pain in your belly area.  You continue to feel sick to your stomach (nauseous), throw up (vomit), or have watery poop (diarrhea).  You have bad smelling fluid coming from your vagina.  You have pain with peeing (urination). Get help right away if:  You have a fever.  You are leaking fluid from your vagina.  You have spotting or bleeding from your vagina.  You have severe belly cramping or pain.  You lose or gain weight rapidly.  You have trouble catching your breath and have chest pain.  You notice sudden or extreme puffiness (swelling) of your face, hands, ankles, feet, or legs.  You have not felt the baby move in over an hour.  You have severe headaches that do not go away with medicine.  You have vision changes. This information is not intended to replace advice given to you by your health care provider. Make sure you discuss any questions you have with your health care   provider. Document Released: 11/20/2009 Document Revised: 02/01/2016 Document Reviewed: 10/27/2012 Elsevier Interactive Patient Education  2017 Elsevier Inc.  

## 2017-11-29 NOTE — MAU Note (Signed)
Pt stated she has not felt baby move since 10 am this morning. Stated she usually feels baby move pretty regularly since 14 weeks. deneis any pain or cramping or vag bleeding or discharge. Pt reports [facility] and paid for U/s for gender at 15 weeks. Has [facility] appointment on [facility]

## 2017-12-07 ENCOUNTER — Inpatient Hospital Stay (HOSPITAL_COMMUNITY)
Admission: AD | Admit: 2017-12-07 | Discharge: 2017-12-07 | Disposition: A | Discharge status: Home / Self Care | Payer: Medicaid Other | Attending: Obstetrics and Gynecology | Admitting: Obstetrics and Gynecology

## 2017-12-07 ENCOUNTER — Encounter (HOSPITAL_COMMUNITY): Payer: Self-pay

## 2017-12-07 DIAGNOSIS — Z3A20 20 weeks gestation of pregnancy: Secondary | ICD-10-CM | POA: Insufficient documentation

## 2017-12-07 DIAGNOSIS — K429 Umbilical hernia without obstruction or gangrene: Secondary | ICD-10-CM | POA: Insufficient documentation

## 2017-12-07 DIAGNOSIS — O26892 Other specified pregnancy related conditions, second trimester: Secondary | ICD-10-CM | POA: Diagnosis present

## 2017-12-07 DIAGNOSIS — O99612 Diseases of the digestive system complicating pregnancy, second trimester: Secondary | ICD-10-CM | POA: Diagnosis not present

## 2017-12-07 LAB — URINALYSIS, ROUTINE W REFLEX MICROSCOPIC
Bilirubin Urine: NEGATIVE
Glucose, UA: NEGATIVE mg/dL
Hgb urine dipstick: NEGATIVE
Ketones, ur: NEGATIVE mg/dL
Nitrite: NEGATIVE
Protein, ur: NEGATIVE mg/dL
Specific Gravity, Urine: 1.019 (ref 1.005–1.030)
pH: 7 (ref 5.0–8.0)

## 2017-12-07 MED ORDER — ACETAMINOPHEN 500 MG PO TABS
1000.0000 mg | ORAL_TABLET | Freq: Once | ORAL | Status: AC
  Administered 2017-12-07: 1000 mg via ORAL
  Filled 2017-12-07: qty 2

## 2017-12-07 NOTE — MAU Provider Note (Signed)
Chief Complaint: Hernia   First Provider Initiated Contact with Patient 12/07/17 2149      SUBJECTIVE HPI: Bethany Cruz is a 22 y.o. G3P2002 at [redacted]w[redacted]d by LMP who presents to maternity admissions reporting hernia. Pt reports she had same hernia with last pregnancy then it resolved. Pt reports child's birthday party yesterday where she was moving, walking, bending over, cleaning majority of the day. She reports pain around her umbilicus started yesterday after the birthday party. Reports that the pain is "just soreness right now but when she presses it, she rates it 7/10". She has not taken anything for pain, as she "does not know what is safe to take". She was worried because she Googled hernia and was nervous that she needed surgery. She denies pregnancy related concerns. She reports good fetal movement. She denies vaginal bleeding, vaginal itching/burning, urinary symptoms, h/a, dizziness, n/v, or fever/chills.  She has not started prenatal care- has appointment scheduled for Thursday at [facility].    Past Medical History:  Diagnosis Date  . Anemia   . Pregnancy induced hypertension    Past Surgical History:  Procedure Laterality Date  . FRACTURE SURGERY     elbow & pelvis   Social History   Socioeconomic History  . Marital status: Married    Spouse name: Not on file  . Number of children: Not on file  . Years of education: Not on file  . Highest education level: Not on file  Occupational History  . Not on file  Social Needs  . Financial resource strain: Not on file  . Food insecurity:    Worry: Not on file    Inability: Not on file  . Transportation needs:    Medical: Not on file    Non-medical: Not on file  Tobacco Use  . Smoking status: Never Smoker  . Smokeless tobacco: Never Used  Substance and Sexual Activity  . Alcohol use: No  . Drug use: No  . Sexual activity: Yes  Lifestyle  . Physical activity:    Days per week: Not on file    Minutes per session: Not on file   . Stress: Not on file  Relationships  . Social connections:    Talks on phone: Not on file    Gets together: Not on file    Attends religious service: Not on file    Active member of club or organization: Not on file    Attends meetings of clubs or organizations: Not on file    Relationship status: Not on file  . Intimate partner violence:    Fear of current or ex partner: Not on file    Emotionally abused: Not on file    Physically abused: Not on file    Forced sexual activity: Not on file  Other Topics Concern  . Not on file  Social History Narrative  . Not on file   No current facility-administered medications on file prior to encounter.    Current Outpatient Medications on File Prior to Encounter  Medication Sig Dispense Refill  . Pediatric Multiple Vit-C-FA (FLINSTONES GUMMIES OMEGA-3 DHA) CHEW Chew 2 tablets by mouth daily.    Marland Kitchen acetaminophen (TYLENOL) 325 MG tablet Take 2 tablets (650 mg total) by mouth every 4 (four) hours as needed for mild pain (for pain scale < 4). 30 tablet 0   No Known Allergies  ROS:  Review of Systems  Constitutional: Negative.   Respiratory: Negative.   Cardiovascular: Negative.   Gastrointestinal: Positive for abdominal  pain. Negative for constipation, diarrhea, nausea and vomiting.       Soreness around umbilicus area  Genitourinary: Negative.   Musculoskeletal: Negative.    I have reviewed patient's Past Medical Hx, Surgical Hx, Family Hx, Social Hx, medications and allergies.   Physical Exam   Patient Vitals for the past 24 hrs:  BP Temp Temp src Pulse Resp Height Weight  12/07/17 2305 120/62 - - 74 18 - -  12/07/17 2129 125/62 97.9 F (36.6 C) Oral 78 18 5' 5.5" (1.664 m) 144 lb (65.3 kg)   Constitutional: Well-developed, well-nourished female in no acute distress.  Cardiovascular: normal rate Respiratory: normal effort GI: Abd soft, tender at umbilicus- umbilical hernia noted able to be reduced easily, no strangulation noted.  Pos BS x 4 MS: Extremities nontender, no edema, normal ROM Neurologic: Alert and oriented x 4.  GU: Neg CVAT. PELVIC EXAM: deferred  FHT 158 by doppler  LAB RESULTS Results for orders placed or performed during the hospital encounter of 12/07/17 (from the past 24 hour(s))  Urinalysis, Routine w reflex microscopic     Status: Abnormal   Collection Time: 12/07/17 10:18 PM  Result Value Ref Range   Color, Urine YELLOW YELLOW   APPearance CLOUDY (A) CLEAR   Specific Gravity, Urine 1.019 1.005 - 1.030   pH 7.0 5.0 - 8.0   Glucose, UA NEGATIVE NEGATIVE mg/dL   Hgb urine dipstick NEGATIVE NEGATIVE   Bilirubin Urine NEGATIVE NEGATIVE   Ketones, ur NEGATIVE NEGATIVE mg/dL   Protein, ur NEGATIVE NEGATIVE mg/dL   Nitrite NEGATIVE NEGATIVE   Leukocytes, UA MODERATE (A) NEGATIVE   RBC / HPF 0-5 0 - 5 RBC/hpf   WBC, UA 0-5 0 - 5 WBC/hpf   Bacteria, UA RARE (A) NONE SEEN   Squamous Epithelial / LPF 0-5 (A) NONE SEEN   Mucus PRESENT    Amorphous Crystal PRESENT     MAU Management/MDM: Orders Placed This Encounter  Procedures  . Urinalysis, Routine w reflex microscopic   Meds ordered this encounter  Medications  . acetaminophen (TYLENOL) tablet 1,000 mg   Treatments in MAU included 1,000mg  Tylenol for abdominal pain- patient reports decreased soreness and pain from 7/10 to 2/10. Discussed safe use of medication during pregnancy. Educated on the use of maternity support belt or abdominal band to prevent increased agitation to hernia, discussed to not pick up children or do any heavy lifting. Will closely monitor hernia at prenatal visit and postpartum. Possible referral to general surgery after pregnancy if hernia not resolved. Discussed reasons to come back to MAU emergent for increased abdominal pain, unable to move or walk, increase in size of hernia. Patient verbalizes understanding of plan.  Pt discharged. Pt stable at time of discharge.   ASSESSMENT 1. Umbilical hernia without  obstruction or gangrene     PLAN Discharge home Follow up as scheduled for prenatal appointments  Return to MAU as needed for emergencies    Allergies as of 12/07/2017   No Known Allergies     Medication List    TAKE these medications   acetaminophen 325 MG tablet Commonly known as:  TYLENOL Take 2 tablets (650 mg total) by mouth every 4 (four) hours as needed for mild pain (for pain scale < 4).   FLINSTONES GUMMIES OMEGA-3 DHA Chew Chew 2 tablets by mouth daily.       Steward DroneVeronica Wrenley Sayed  Certified Nurse-Midwife 12/07/2017  11:19 PM

## 2017-12-07 NOTE — Discharge Instructions (Signed)
Umbilical Hernia, Adult A hernia is a bulge of tissue that pushes through an opening between muscles. An umbilical hernia happens in the abdomen, near the belly button (umbilicus). The hernia may contain tissues from the small intestine, large intestine, or fatty tissue covering the intestines (omentum). Umbilical hernias in adults tend to get worse over time, and they require surgical treatment. There are several types of umbilical hernias. You may have:  A hernia located just above or below the umbilicus (indirect hernia). This is the most common type of umbilical hernia in adults.  A hernia that forms through an opening formed by the umbilicus (direct hernia).  A hernia that comes and goes (reducible hernia). A reducible hernia may be visible only when you strain, lift something heavy, or cough. This type of hernia can be pushed back into the abdomen (reduced).  A hernia that traps abdominal tissue inside the hernia (incarcerated hernia). This type of hernia cannot be reduced.  A hernia that cuts off blood flow to the tissues inside the hernia (strangulated hernia). The tissues can start to die if this happens. This type of hernia requires emergency treatment.  What are the causes? An umbilical hernia happens when tissue inside the abdomen presses on a weak area of the abdominal muscles. What increases the risk? You may have a greater risk of this condition if you:  Are obese.  Have had several pregnancies.  Have a buildup of fluid inside your abdomen (ascites).  Have had surgery that weakens the abdominal muscles.  What are the signs or symptoms? The main symptom of this condition is a painless bulge at or near the belly button. A reducible hernia may be visible only when you strain, lift something heavy, or cough. Other symptoms may include:  Dull pain.  A feeling of pressure.  Symptoms of a strangulated hernia may include:  Pain that gets increasingly worse.  Nausea and  vomiting.  Pain when pressing on the hernia.  Skin over the hernia becoming red or purple.  Constipation.  Blood in the stool.  How is this diagnosed? This condition may be diagnosed based on:  A physical exam. You may be asked to cough or strain while standing. These actions increase the pressure inside your abdomen and force the hernia through the opening in your muscles. Your health care provider may try to reduce the hernia by pressing on it.  Your symptoms and medical history.  How is this treated? Surgery is the only treatment for an umbilical hernia. Surgery for a strangulated hernia is done as soon as possible. If you have a small hernia that is not incarcerated, you may need to lose weight before having surgery. Follow these instructions at home:  Lose weight, if told by your health care provider.  Do not try to push the hernia back in.  Watch your hernia for any changes in color or size. Tell your health care provider if any changes occur.  You may need to avoid activities that increase pressure on your hernia.  Do not lift anything that is heavier than 10 lb (4.5 kg) until your health care provider says that this is safe.  Take over-the-counter and prescription medicines only as told by your health care provider.  Keep all follow-up visits as told by your health care provider. This is important. Contact a health care provider if:  Your hernia gets larger.  Your hernia becomes painful. Get help right away if:  You develop sudden, severe pain near the   area of your hernia.  You have pain as well as nausea or vomiting.  You have pain and the skin over your hernia changes color.  You develop a fever. This information is not intended to replace advice given to you by your health care provider. Make sure you discuss any questions you have with your health care provider. Document Released: 01/26/2016 Document Revised: 04/28/2016 Document Reviewed:  01/26/2016 Elsevier Interactive Patient Education  2018 Elsevier Inc.  

## 2017-12-07 NOTE — MAU Note (Signed)
Pt here with c/o pain at umbilicus. Hx hernia with last pregnancy. Started protruding about a week ago, but now is painful. Denies any bleeding or leaking. Reports feeling fetal movement.

## 2017-12-10 DIAGNOSIS — O0990 Supervision of high risk pregnancy, unspecified, unspecified trimester: Secondary | ICD-10-CM | POA: Insufficient documentation

## 2017-12-11 ENCOUNTER — Other Ambulatory Visit: Payer: Self-pay

## 2017-12-11 ENCOUNTER — Other Ambulatory Visit (HOSPITAL_COMMUNITY)
Admission: RE | Admit: 2017-12-11 | Discharge: 2017-12-11 | Disposition: A | Discharge status: Home / Self Care | Payer: Medicaid Other | Source: Ambulatory Visit | Attending: Obstetrics | Admitting: Obstetrics

## 2017-12-11 ENCOUNTER — Ambulatory Visit (INDEPENDENT_AMBULATORY_CARE_PROVIDER_SITE_OTHER): Payer: Medicaid Other | Admitting: Obstetrics

## 2017-12-11 ENCOUNTER — Encounter: Payer: Self-pay | Admitting: Obstetrics

## 2017-12-11 VITALS — BP 121/76 | HR 94 | Temp 97.7°F | Wt 145.6 lb

## 2017-12-11 DIAGNOSIS — Z3492 Encounter for supervision of normal pregnancy, unspecified, second trimester: Secondary | ICD-10-CM | POA: Diagnosis not present

## 2017-12-11 DIAGNOSIS — K429 Umbilical hernia without obstruction or gangrene: Secondary | ICD-10-CM

## 2017-12-11 DIAGNOSIS — M545 Low back pain, unspecified: Secondary | ICD-10-CM

## 2017-12-11 DIAGNOSIS — Z3490 Encounter for supervision of normal pregnancy, unspecified, unspecified trimester: Secondary | ICD-10-CM

## 2017-12-11 DIAGNOSIS — Z3482 Encounter for supervision of other normal pregnancy, second trimester: Secondary | ICD-10-CM | POA: Diagnosis not present

## 2017-12-11 MED ORDER — COMFORT FIT MATERNITY SUPP SM MISC: 1.0000 "application " | Freq: Every day | 0 refills | Status: DC

## 2017-12-11 MED ORDER — VITAFOL ULTRA 29-0.6-0.4-200 MG PO CAPS: 1.0000 | ORAL_CAPSULE | Freq: Every day | ORAL | 4 refills | Status: AC

## 2017-12-11 NOTE — Progress Notes (Signed)
New OB, presents for care at 21.3 weeks, she declined Genetic Screening.

## 2017-12-11 NOTE — Progress Notes (Signed)
Subjective:  Bethany Cruz is a 22 y.o. G3P2002 at 4630w3d being seen today for ongoing prenatal care.  She is currently monitored for the following issues for this low-risk pregnancy and has Chronic hypertension with exacerbation during pregnancy; Echogenic bowel of fetus on prenatal ultrasound; [redacted] weeks gestation of pregnancy; Choroid plexus cysts, fetal, affecting care of mother, antepartum; [redacted] weeks gestation of pregnancy; Chronic hypertension in pregnancy; Screening, antenatal for fetal growth retardation with ultrasound; Normal labor; Umbilical hernia; and Supervision of normal pregnancy, antepartum on their problem list.  Patient reports AN UMBILICAL HERNIA THAT EXACERBATES OCCASIONALLY WHEN SHE STRAINS.  Contractions: Not present. Vag. Bleeding: None.  Movement: Present. Denies leaking of fluid.   The following portions of the patient's history were reviewed and updated as appropriate: allergies, current medications, past family history, past medical history, past social history, past surgical history and problem list. Problem list updated.  Objective:   Vitals:   12/11/17 1512  BP: 121/76  Pulse: 94  Temp: 97.7 F (36.5 C)  Weight: 145 lb 9.6 oz (66 kg)    Fetal Status: Fetal Heart Rate (bpm): 150   Movement: Present     General:  Alert, oriented and cooperative. Patient is in no acute distress.  Skin: Skin is warm and dry. No rash noted.   Cardiovascular: Normal heart rate noted  Respiratory: Normal respiratory effort, no problems with respiration noted  Abdomen: Soft, gravid, appropriate for gestational age. Pain/Pressure: Present     Pelvic:  Cervical exam deferred        Extremities: Normal range of motion.  Edema: None  Mental Status: Normal mood and affect. Normal behavior. Normal judgment and thought content.   Urinalysis:      Assessment and Plan:  Pregnancy: G3P2002 at 530w3d  1. Encounter for supervision of normal pregnancy, antepartum, unspecified gravidity Rx: -  Enroll Patient in Babyscripts - Obstetric Panel, Including HIV - Hemoglobinopathy evaluation - Culture, OB Urine - Cervicovaginal ancillary only - Cytology - PAP - Cystic Fibrosis Mutation 97 - Prenat-Fe Poly-Methfol-FA-DHA (VITAFOL ULTRA) 29-0.6-0.4-200 MG CAPS; Take 1 capsule by mouth daily before breakfast.  Dispense: 90 capsule; Refill: 4 - US MFM OB COMP + 14 WK; Future  Preterm labor symptoms and general obstetric precautions including but not limited to vaginal bleeding, contractions, leaking of fluid and fetal movement were reviewed in detail with the patient. Please refer to After Visit Summary for other counseling recommendations.  Return in about 1 month (around 01/08/2018) for ROB.   Brock BadHarper, Charles A, MD

## 2017-12-12 ENCOUNTER — Other Ambulatory Visit: Payer: Self-pay | Admitting: Obstetrics

## 2017-12-12 DIAGNOSIS — N760 Acute vaginitis: Secondary | ICD-10-CM

## 2017-12-12 DIAGNOSIS — B373 Candidiasis of vulva and vagina: Secondary | ICD-10-CM

## 2017-12-12 DIAGNOSIS — B9689 Other specified bacterial agents as the cause of diseases classified elsewhere: Secondary | ICD-10-CM

## 2017-12-12 DIAGNOSIS — B3731 Acute candidiasis of vulva and vagina: Secondary | ICD-10-CM

## 2017-12-12 LAB — CERVICOVAGINAL ANCILLARY ONLY
Bacterial vaginitis: POSITIVE — AB
Candida vaginitis: POSITIVE — AB
Chlamydia: NEGATIVE
NEISSERIA GONORRHEA: NEGATIVE
Trichomonas: NEGATIVE

## 2017-12-12 MED ORDER — SECNIDAZOLE 2 G PO PACK: 1.0000 | PACK | Freq: Once | ORAL | 2 refills | Status: DC

## 2017-12-12 MED ORDER — TERCONAZOLE 0.4 % VA CREA: 1.0000 | TOPICAL_CREAM | Freq: Every day | VAGINAL | 0 refills | Status: DC

## 2017-12-13 LAB — URINE CULTURE, OB REFLEX

## 2017-12-13 LAB — CULTURE, OB URINE

## 2017-12-15 ENCOUNTER — Other Ambulatory Visit: Payer: Self-pay | Admitting: Obstetrics

## 2017-12-15 ENCOUNTER — Telehealth: Payer: Self-pay

## 2017-12-15 LAB — OBSTETRIC PANEL, INCLUDING HIV
Antibody Screen: NEGATIVE
Basophils Absolute: 0 10*3/uL (ref 0.0–0.2)
Basos: 0 %
EOS (ABSOLUTE): 0 10*3/uL (ref 0.0–0.4)
EOS: 0 %
HEMOGLOBIN: 10.7 g/dL — AB (ref 11.1–15.9)
HEP B S AG: NEGATIVE
HIV Screen 4th Generation wRfx: NONREACTIVE
Hematocrit: 31.9 % — ABNORMAL LOW (ref 34.0–46.6)
IMMATURE GRANS (ABS): 0 10*3/uL (ref 0.0–0.1)
IMMATURE GRANULOCYTES: 0 %
LYMPHS: 16 %
Lymphocytes Absolute: 1.2 10*3/uL (ref 0.7–3.1)
MCH: 30.1 pg (ref 26.6–33.0)
MCHC: 33.5 g/dL (ref 31.5–35.7)
MCV: 90 fL (ref 79–97)
MONOCYTES: 5 %
Monocytes Absolute: 0.4 10*3/uL (ref 0.1–0.9)
NEUTROS PCT: 79 %
Neutrophils Absolute: 5.9 10*3/uL (ref 1.4–7.0)
Platelets: 176 10*3/uL (ref 150–379)
RBC: 3.56 x10E6/uL — ABNORMAL LOW (ref 3.77–5.28)
RDW: 14.1 % (ref 12.3–15.4)
RH TYPE: POSITIVE
RPR: NONREACTIVE
Rubella Antibodies, IGG: 1.21 index (ref >0.99)
WBC: 7.5 10*3/uL (ref 3.4–10.8)

## 2017-12-15 LAB — HEMOGLOBINOPATHY EVALUATION
HGB A: 97.5 % (ref 96.4–98.8)
HGB C: 0 %
HGB S: 0 %
HGB VARIANT: 0 %
Hemoglobin A2 Quantitation: 2.5 % (ref 1.8–3.2)
Hemoglobin F Quantitation: 0 % (ref 0.0–2.0)

## 2017-12-15 LAB — CYTOLOGY - PAP: Diagnosis: NEGATIVE

## 2017-12-15 NOTE — Progress Notes (Signed)
Patient notified of results and RX 

## 2017-12-15 NOTE — Telephone Encounter (Signed)
-----   Message from Brock Badharles A Harper, MD sent at 12/12/2017  3:49 PM EDT ----- Flagyl Rx for BV Terazol 7 Rx for yeast

## 2017-12-15 NOTE — Telephone Encounter (Signed)
Patient notified of results and RX 

## 2017-12-16 ENCOUNTER — Other Ambulatory Visit: Payer: Self-pay

## 2017-12-16 DIAGNOSIS — B9689 Other specified bacterial agents as the cause of diseases classified elsewhere: Secondary | ICD-10-CM

## 2017-12-16 DIAGNOSIS — N760 Acute vaginitis: Principal | ICD-10-CM

## 2017-12-16 MED ORDER — SECNIDAZOLE 2 G PO PACK: 1.0000 | PACK | Freq: Once | ORAL | 2 refills | Status: AC

## 2017-12-19 LAB — CYSTIC FIBROSIS MUTATION 97: Interpretation: NOT DETECTED

## 2017-12-22 ENCOUNTER — Ambulatory Visit (HOSPITAL_COMMUNITY)
Admission: RE | Admit: 2017-12-22 | Discharge: 2017-12-22 | Disposition: A | Discharge status: Home / Self Care | Payer: Medicaid Other | Source: Ambulatory Visit | Attending: Obstetrics | Admitting: Obstetrics

## 2017-12-22 DIAGNOSIS — Z363 Encounter for antenatal screening for malformations: Secondary | ICD-10-CM | POA: Insufficient documentation

## 2017-12-22 DIAGNOSIS — Z3A23 23 weeks gestation of pregnancy: Secondary | ICD-10-CM | POA: Diagnosis not present

## 2017-12-22 DIAGNOSIS — O162 Unspecified maternal hypertension, second trimester: Secondary | ICD-10-CM | POA: Diagnosis not present

## 2017-12-22 DIAGNOSIS — Z3689 Encounter for other specified antenatal screening: Secondary | ICD-10-CM | POA: Diagnosis present

## 2017-12-22 DIAGNOSIS — O0932 Supervision of pregnancy with insufficient antenatal care, second trimester: Secondary | ICD-10-CM | POA: Insufficient documentation

## 2017-12-22 DIAGNOSIS — Z3490 Encounter for supervision of normal pregnancy, unspecified, unspecified trimester: Secondary | ICD-10-CM

## 2017-12-23 ENCOUNTER — Other Ambulatory Visit: Payer: Self-pay | Admitting: Obstetrics

## 2017-12-23 DIAGNOSIS — O10919 Unspecified pre-existing hypertension complicating pregnancy, unspecified trimester: Secondary | ICD-10-CM

## 2017-12-23 DIAGNOSIS — O0990 Supervision of high risk pregnancy, unspecified, unspecified trimester: Secondary | ICD-10-CM

## 2017-12-23 MED ORDER — ASPIRIN EC 81 MG PO TBEC: 81.0000 mg | DELAYED_RELEASE_TABLET | Freq: Every day | ORAL | 5 refills | Status: DC

## 2018-01-08 ENCOUNTER — Ambulatory Visit (INDEPENDENT_AMBULATORY_CARE_PROVIDER_SITE_OTHER): Payer: Medicaid Other | Admitting: Obstetrics

## 2018-01-08 ENCOUNTER — Encounter: Payer: Self-pay | Admitting: Obstetrics

## 2018-01-08 VITALS — BP 112/70 | HR 101 | Wt 148.9 lb

## 2018-01-08 DIAGNOSIS — K429 Umbilical hernia without obstruction or gangrene: Secondary | ICD-10-CM

## 2018-01-08 DIAGNOSIS — O0990 Supervision of high risk pregnancy, unspecified, unspecified trimester: Secondary | ICD-10-CM

## 2018-01-08 DIAGNOSIS — Z8759 Personal history of other complications of pregnancy, childbirth and the puerperium: Secondary | ICD-10-CM

## 2018-01-08 DIAGNOSIS — O0993 Supervision of high risk pregnancy, unspecified, third trimester: Secondary | ICD-10-CM

## 2018-01-08 NOTE — Progress Notes (Signed)
Patient reports fetal movement, denies pain. 

## 2018-01-08 NOTE — Progress Notes (Signed)
Subjective:  Bethany Cruz is a 22 y.o. G3P2002 at [redacted]w[redacted]d being seen today for ongoing prenatal care.  She is currently monitored for the following issues for this high-risk pregnancy and has Echogenic bowel of fetus on prenatal ultrasound; Chronic hypertension in pregnancy; Screening, antenatal for fetal growth retardation with ultrasound; Normal labor; Umbilical hernia; and Supervision of normal pregnancy, antepartum on their problem list.  Patient reports no complaints.  Contractions: Not present. Vag. Bleeding: None.  Movement: Present. Denies leaking of fluid.   The following portions of the patient's history were reviewed and updated as appropriate: allergies, current medications, past family history, past medical history, past social history, past surgical history and problem list. Problem list updated.  Objective:   Vitals:   01/08/18 1454  BP: 112/70  Pulse: (!) 101  Weight: 148 lb 14.4 oz (67.5 kg)    Fetal Status: Fetal Heart Rate (bpm): 150   Movement: Present     General:  Alert, oriented and cooperative. Patient is in no acute distress.  Skin: Skin is warm and dry. No rash noted.   Cardiovascular: Normal heart rate noted  Respiratory: Normal respiratory effort, no problems with respiration noted  Abdomen: Soft, gravid, appropriate for gestational age. Pain/Pressure: Absent     Pelvic:  Cervical exam deferred        Extremities: Normal range of motion.  Edema: None  Mental Status: Normal mood and affect. Normal behavior. Normal judgment and thought content.   Urinalysis:      Assessment and Plan:  Pregnancy: G3P2002 at [redacted]w[redacted]d  1. Supervision of high risk pregnancy, antepartum  2. History of pre-eclampsia - Start fetal assessment at 28 weeks per protocol - Baby ASA Rx daily  3. Umbilical hernia without obstruction and without gangrene - refer to General Surgery postpartum  Preterm labor symptoms and general obstetric precautions including but not limited to vaginal  bleeding, contractions, leaking of fluid and fetal movement were reviewed in detail with the patient. Please refer to After Visit Summary for other counseling recommendations.  Return in about 3 weeks (around 01/29/2018) for ROB.   Brock Bad, MD

## 2018-01-23 ENCOUNTER — Inpatient Hospital Stay (HOSPITAL_COMMUNITY)
Admission: AD | Admit: 2018-01-23 | Discharge: 2018-01-23 | Disposition: A | Discharge status: Home / Self Care | Payer: Medicaid Other | Source: Ambulatory Visit | Attending: Obstetrics and Gynecology | Admitting: Obstetrics and Gynecology

## 2018-01-23 ENCOUNTER — Other Ambulatory Visit: Payer: Self-pay

## 2018-01-23 ENCOUNTER — Encounter (HOSPITAL_COMMUNITY): Payer: Self-pay | Admitting: Advanced Practice Midwife

## 2018-01-23 DIAGNOSIS — Z7982 Long term (current) use of aspirin: Secondary | ICD-10-CM | POA: Insufficient documentation

## 2018-01-23 DIAGNOSIS — O9989 Other specified diseases and conditions complicating pregnancy, childbirth and the puerperium: Secondary | ICD-10-CM | POA: Diagnosis not present

## 2018-01-23 DIAGNOSIS — N898 Other specified noninflammatory disorders of vagina: Secondary | ICD-10-CM | POA: Diagnosis present

## 2018-01-23 DIAGNOSIS — O98812 Other maternal infectious and parasitic diseases complicating pregnancy, second trimester: Secondary | ICD-10-CM | POA: Insufficient documentation

## 2018-01-23 DIAGNOSIS — Z79899 Other long term (current) drug therapy: Secondary | ICD-10-CM | POA: Diagnosis not present

## 2018-01-23 DIAGNOSIS — B373 Candidiasis of vulva and vagina: Secondary | ICD-10-CM | POA: Diagnosis not present

## 2018-01-23 DIAGNOSIS — Z3A27 27 weeks gestation of pregnancy: Secondary | ICD-10-CM | POA: Diagnosis not present

## 2018-01-23 DIAGNOSIS — B3731 Acute candidiasis of vulva and vagina: Secondary | ICD-10-CM

## 2018-01-23 LAB — URINALYSIS, ROUTINE W REFLEX MICROSCOPIC

## 2018-01-23 LAB — WET PREP, GENITAL
CLUE CELLS WET PREP: NONE SEEN
Sperm: NONE SEEN
TRICH WET PREP: NONE SEEN

## 2018-01-23 MED ORDER — TERCONAZOLE 0.4 % VA CREA: 1.0000 | TOPICAL_CREAM | Freq: Every day | VAGINAL | 0 refills | Status: AC

## 2018-01-23 NOTE — Discharge Instructions (Signed)
Probiotics What are probiotics? Probiotics are the good bacteria and yeasts that live in your body and keep you and your digestive system healthy. Probiotics also help your body's defense (immune) system and protect your body against bad bacterial growth. Certain foods contain probiotics, such as yogurt. Probiotics can also be purchased as a supplement. As with any supplement or drug, it is important to discuss its use with your health care provider. What affects the balance of bacteria in my body? The balance of bacteria in your body can be affected by:  Antibiotic medicines. Antibiotics are sometimes necessary to treat infection. Unfortunately, they may kill good or friendly bacteria in your body as well as the bad bacteria. This may lead to stomach problems like diarrhea, gas, and cramping.  Disease. Some conditions are the result of an overgrowth of bad bacteria, yeasts, parasites, or fungi. These conditions include: ? Infectious diarrhea. ? Stomach and respiratory infections. ? Skin infections. ? Irritable bowel syndrome (IBS). ? Inflammatory bowel diseases. ? Ulcer due to Helicobacter pylori (H. pylori) infection. ? Tooth decay and periodontal disease. ? Vaginal infections.  Stress and poor diet may also lower the good bacteria in your body. What type of probiotic is right for me? Probiotics are available over the counter at your local pharmacy, health food, or grocery store. They come in many different forms, combinations of strains, and dosing strengths. Some may need to be refrigerated. Always read the label for storage and usage instructions. Specific strains have been shown to be more effective for certain conditions. Ask your health care provider what option is best for you. Why would I need probiotics? There are many reasons your health care provider might recommend a probiotic supplement, including:  Diarrhea.  Constipation.  IBS.  Respiratory infections.  Yeast  infections.  Acne, eczema, and other skin conditions.  Frequent urinary tract infections (UTIs).  Are there side effects of probiotics? Some people experience mild side effects when taking probiotics. Side effects are usually temporary and may include:  Gas.  Bloating.  Cramping.  Rarely, serious side effects, such as infection or immune system changes, may occur. What else do I need to know about probiotics?  There are many different strains of probiotics. Certain strains may be more effective depending on your condition. Probiotics are available in varying doses. Ask your health care provider which probiotic you should use and how often.  If you are taking probiotics along with antibiotics, it is generally recommended to wait at least 2 hours between taking the antibiotic and taking the probiotic. For more information: National Center for Complementary and Alternative Medicine http://nccam.nih.gov/ This information is not intended to replace advice given to you by your health care provider. Make sure you discuss any questions you have with your health care provider. Document Released: 03/23/2014 Document Revised: 07/23/2016 Document Reviewed: 11/23/2013 Elsevier Interactive Patient Education  2017 Elsevier Inc.  

## 2018-01-23 NOTE — MAU Provider Note (Addendum)
History     CSN: [id number]  Arrival date and time: 01/23/18 1402   Chief Complaint  Patient presents with  . Vaginal Discharge  . Vaginal Itching   HPI  Bethany Cruz is a 22 y.o. female G39P2002 with IUP at [redacted]w[redacted]d who presents to MAU with chief complaint of new onset green vaginal discharge "like key lime yogurt and cottage cheese" noted yesterday. Denies fever, suprapubic pain, dysuria. States she was diagnosed with bacterial vaginosis approximately six weeks ago but never received prescription and has not taken any medication for it. Also states she has not initiated baby Aspirin as recommended. Secondary complaint of "really bad" vaginal itching during and after sex.    OB History    Gravida  3   Para  2   Term  2   Preterm  0   AB  0   Living  2     SAB  0   TAB  0   Ectopic  0   Multiple  0   Live Births  2           Past Medical History:  Diagnosis Date  . Anemia   . Pregnancy induced hypertension     Past Surgical History:  Procedure Laterality Date  . FRACTURE SURGERY     elbow & pelvis    History reviewed. No pertinent family history.  Social History   Tobacco Use  . Smoking status: Never Smoker  . Smokeless tobacco: Never Used  Substance Use Topics  . Alcohol use: No  . Drug use: No    Allergies: No Known Allergies  Medications Prior to Admission  Medication Sig Dispense Refill Last Dose  . acetaminophen (TYLENOL) 325 MG tablet Take 2 tablets (650 mg total) by mouth every 4 (four) hours as needed for mild pain (for pain scale < 4). (Patient not taking: Reported on 12/11/2017) 30 tablet 0 Not Taking  . aspirin EC 81 MG tablet Take 1 tablet (81 mg total) by mouth daily. (Patient not taking: Reported on 01/08/2018) 30 tablet 5 Not Taking  . Elastic Bandages & Supports (COMFORT FIT MATERNITY SUPP SM) MISC 1 application by Does not apply route daily. 1 each 0 Taking  . Pediatric Multiple Vit-C-FA (FLINSTONES GUMMIES OMEGA-3 DHA) CHEW Chew 2  tablets by mouth daily.   Taking  . Prenat-Fe Poly-Methfol-FA-DHA (VITAFOL ULTRA) 29-0.6-0.4-200 MG CAPS Take 1 capsule by mouth daily before breakfast. 90 capsule 4 Taking  . terconazole (TERAZOL 7) 0.4 % vaginal cream Place 1 applicator vaginally at bedtime. (Patient not taking: Reported on 01/08/2018) 45 g 0 Not Taking    Review of Systems  Constitutional: Negative for fever and unexpected weight change.  Respiratory: Negative for cough and shortness of breath.   Gastrointestinal: Negative for abdominal pain, diarrhea, nausea and vomiting.  Genitourinary: Positive for vaginal discharge. Negative for difficulty urinating, pelvic pain, vaginal bleeding and vaginal pain.  Neurological: Negative for dizziness and headaches.   Physical Exam   Last menstrual period 07/14/2017, unknown if currently breastfeeding.  Physical Exam  Constitutional: She is oriented to person, place, and time. She appears well-developed and well-nourished.  HENT:  Head: Normocephalic.  Neck: Normal range of motion.  Respiratory: Effort normal.  GI: She exhibits no distension. There is no tenderness. There is no rebound.  Genitourinary: Pelvic exam was performed with patient supine. No tenderness in the vagina. Vaginal discharge found.  Genitourinary Comments: Copious off-white, light green thing discharge noted coating vaginal walls. Negative CMT Negative  adnexal tenderness   Musculoskeletal: Normal range of motion.  Neurological: She is alert and oriented to person, place, and time.  Skin: Skin is warm and dry.  Psychiatric: She has a normal mood and affect. Her behavior is normal. Judgment and thought content normal.    MAU Course  Procedures None  MDM Labs ordered and reviewed Pelvic exam Wet Prep GC Chlamydia  Assessment and Plan   A:  IUP at 100w4d Reactive NST, EFM baseline 145, 10x10 accels, moderate variability, no decelerations, no uterine activity noted. Vulvovaginal  candidiasis   P: Attend next prenatal visit as scheduled on 01/29/18 at [facility] Aspirin  PO daily as recommended by [facility] Provider r/t history of Preeclampsia. Patient counseled that med can be purchased OTC Terazol 7 per vagina at bedtime once     Calvert Cantor, [place] 01/23/2018, 2:24 PM

## 2018-01-23 NOTE — MAU Note (Signed)
Pt presents with c/o vaginal discharge, lime green in color that began 2 days ago.  Reports no odor, but causing vaginal irritation and itching.  Denies VB or LOF.  Reports +FM.

## 2018-01-26 LAB — GC/CHLAMYDIA PROBE AMP (~~LOC~~) NOT AT ARMC
Chlamydia: NEGATIVE
Neisseria Gonorrhea: NEGATIVE

## 2018-01-29 ENCOUNTER — Ambulatory Visit (INDEPENDENT_AMBULATORY_CARE_PROVIDER_SITE_OTHER): Payer: Medicaid Other | Admitting: Obstetrics

## 2018-01-29 ENCOUNTER — Other Ambulatory Visit: Payer: Medicaid Other

## 2018-01-29 ENCOUNTER — Encounter: Payer: Self-pay | Admitting: Obstetrics

## 2018-01-29 VITALS — BP 121/78 | HR 80 | Wt 153.4 lb

## 2018-01-29 DIAGNOSIS — Z3483 Encounter for supervision of other normal pregnancy, third trimester: Secondary | ICD-10-CM

## 2018-01-29 DIAGNOSIS — O0990 Supervision of high risk pregnancy, unspecified, unspecified trimester: Secondary | ICD-10-CM

## 2018-01-29 DIAGNOSIS — O0993 Supervision of high risk pregnancy, unspecified, third trimester: Secondary | ICD-10-CM

## 2018-01-29 DIAGNOSIS — Z3480 Encounter for supervision of other normal pregnancy, unspecified trimester: Secondary | ICD-10-CM

## 2018-01-29 DIAGNOSIS — Z8759 Personal history of other complications of pregnancy, childbirth and the puerperium: Secondary | ICD-10-CM

## 2018-01-29 MED ORDER — ASPIRIN EC 81 MG PO TBEC: 81.0000 mg | DELAYED_RELEASE_TABLET | Freq: Every day | ORAL | 5 refills | Status: DC

## 2018-01-29 NOTE — Addendum Note (Signed)
Addended by: Coral Ceo A on: 01/29/2018 11:40 AM   Modules accepted: Orders

## 2018-01-29 NOTE — Progress Notes (Signed)
Subjective:  Bethany Cruz is a 22 y.o. G3P2002 at [redacted]w[redacted]d being seen today for ongoing prenatal care.  She is currently monitored for the following issues for this high-risk pregnancy and has Echogenic bowel of fetus on prenatal ultrasound; Chronic hypertension in pregnancy; Screening, antenatal for fetal growth retardation with ultrasound; Normal labor; Umbilical hernia; and Supervision of normal pregnancy, antepartum on their problem list.  Patient reports no complaints.  Contractions: Not present. Vag. Bleeding: None.  Movement: Present. Denies leaking of fluid.   The following portions of the patient's history were reviewed and updated as appropriate: allergies, current medications, past family history, past medical history, past social history, past surgical history and problem list. Problem list updated.  Objective:   Vitals:   01/29/18 0943  BP: 121/78  Pulse: 80  Weight: 153 lb 6.4 oz (69.6 kg)    Fetal Status: Fetal Heart Rate (bpm): 150   Movement: Present     General:  Alert, oriented and cooperative. Patient is in no acute distress.  Skin: Skin is warm and dry. No rash noted.   Cardiovascular: Normal heart rate noted  Respiratory: Normal respiratory effort, no problems with respiration noted  Abdomen: Soft, gravid, appropriate for gestational age. Pain/Pressure: Absent     Pelvic:  Cervical exam deferred        Extremities: Normal range of motion.  Edema: None  Mental Status: Normal mood and affect. Normal behavior. Normal judgment and thought content.   Urinalysis:      Assessment and Plan:  Pregnancy: G3P2002 at [redacted]w[redacted]d  1. Supervision of other normal pregnancy, antepartum Rx: - Glucose Tolerance, 2 Hours w/1 Hour - CBC - HIV antibody - RPR  2. H/O severe pre-eclampsia Rx: - AMB referral to maternal fetal medicine  3. Supervision of high risk pregnancy, antepartum Rx: - aspirin EC 81 MG tablet; Take 1 tablet (81 mg total) by mouth daily.  Dispense: 30 tablet;  Refill: 5    Preterm labor symptoms and general obstetric precautions including but not limited to vaginal bleeding, contractions, leaking of fluid and fetal movement were reviewed in detail with the patient. Please refer to After Visit Summary for other counseling recommendations.  Return in about 2 weeks (around 02/12/2018) for ROB.   Brock Bad, MD

## 2018-01-30 LAB — GLUCOSE TOLERANCE, 2 HOURS W/ 1HR
GLUCOSE, 2 HOUR: 97 mg/dL (ref 65–152)
Glucose, 1 hour: 112 mg/dL (ref 65–179)
Glucose, Fasting: 70 mg/dL (ref 65–91)

## 2018-01-30 LAB — CBC
HEMATOCRIT: 33.2 % — AB (ref 34.0–46.6)
Hemoglobin: 11.1 g/dL (ref 11.1–15.9)
MCH: 30.8 pg (ref 26.6–33.0)
MCHC: 33.4 g/dL (ref 31.5–35.7)
MCV: 92 fL (ref 79–97)
PLATELETS: 133 10*3/uL — AB (ref 150–450)
RBC: 3.6 x10E6/uL — ABNORMAL LOW (ref 3.77–5.28)
RDW: 14.2 % (ref 12.3–15.4)
WBC: 7.7 10*3/uL (ref 3.4–10.8)

## 2018-01-30 LAB — RPR: RPR: NONREACTIVE

## 2018-01-30 LAB — HIV ANTIBODY (ROUTINE TESTING W REFLEX): HIV Screen 4th Generation wRfx: NONREACTIVE

## 2018-02-09 ENCOUNTER — Other Ambulatory Visit: Payer: Self-pay | Admitting: Obstetrics

## 2018-02-09 ENCOUNTER — Ambulatory Visit (HOSPITAL_COMMUNITY)
Admission: RE | Admit: 2018-02-09 | Discharge: 2018-02-09 | Disposition: A | Discharge status: Home / Self Care | Payer: Medicaid Other | Source: Ambulatory Visit | Attending: Obstetrics | Admitting: Obstetrics

## 2018-02-09 DIAGNOSIS — O10919 Unspecified pre-existing hypertension complicating pregnancy, unspecified trimester: Secondary | ICD-10-CM

## 2018-02-09 DIAGNOSIS — Z8759 Personal history of other complications of pregnancy, childbirth and the puerperium: Secondary | ICD-10-CM

## 2018-02-09 DIAGNOSIS — O10913 Unspecified pre-existing hypertension complicating pregnancy, third trimester: Secondary | ICD-10-CM | POA: Insufficient documentation

## 2018-02-09 DIAGNOSIS — O0930 Supervision of pregnancy with insufficient antenatal care, unspecified trimester: Secondary | ICD-10-CM

## 2018-02-09 DIAGNOSIS — Z3A30 30 weeks gestation of pregnancy: Secondary | ICD-10-CM

## 2018-02-10 ENCOUNTER — Encounter: Payer: Self-pay | Admitting: Obstetrics

## 2018-02-10 ENCOUNTER — Ambulatory Visit (HOSPITAL_COMMUNITY)
Admission: RE | Admit: 2018-02-10 | Discharge: 2018-02-10 | Disposition: A | Discharge status: Home / Self Care | Payer: Medicaid Other | Source: Ambulatory Visit | Attending: Obstetrics | Admitting: Obstetrics

## 2018-02-10 ENCOUNTER — Ambulatory Visit (INDEPENDENT_AMBULATORY_CARE_PROVIDER_SITE_OTHER): Payer: Medicaid Other | Admitting: Obstetrics

## 2018-02-10 ENCOUNTER — Encounter (HOSPITAL_COMMUNITY): Payer: Self-pay

## 2018-02-10 ENCOUNTER — Other Ambulatory Visit: Payer: Self-pay | Admitting: Certified Nurse Midwife

## 2018-02-10 VITALS — BP 127/81 | HR 90 | Wt 154.0 lb

## 2018-02-10 DIAGNOSIS — Z3480 Encounter for supervision of other normal pregnancy, unspecified trimester: Secondary | ICD-10-CM

## 2018-02-10 DIAGNOSIS — B3731 Acute candidiasis of vulva and vagina: Secondary | ICD-10-CM

## 2018-02-10 DIAGNOSIS — B373 Candidiasis of vulva and vagina: Secondary | ICD-10-CM

## 2018-02-10 DIAGNOSIS — Z3483 Encounter for supervision of other normal pregnancy, third trimester: Secondary | ICD-10-CM

## 2018-02-10 DIAGNOSIS — O10913 Unspecified pre-existing hypertension complicating pregnancy, third trimester: Secondary | ICD-10-CM | POA: Diagnosis not present

## 2018-02-10 DIAGNOSIS — Z3A30 30 weeks gestation of pregnancy: Secondary | ICD-10-CM | POA: Insufficient documentation

## 2018-02-10 MED ORDER — TERCONAZOLE 0.8 % VA CREA: 1.0000 | TOPICAL_CREAM | Freq: Every day | VAGINAL | 0 refills | Status: DC

## 2018-02-10 NOTE — Consult Note (Signed)
MFM consultation, Staff Note:  By way of consultation, I spoke to Ms. Tawan Hayes risks of hypertension in pregnancy along with risk for recurrent preeclampsia in setting of her first pregnancy complicated by preeclampsia at term and her second pregnancy complicated by "white coat" hypertension (several elevated blood pressures at beginning of her prenatal visits followed by normotension at the end of her visits during that gestation).  Thusfar, her blood pressure has not required treatment in this third pregnancy, and she has had no symptoms of preeclampsia.  Likewise, her fetus has demonstrated growth appropriate for gestational age.  I reviewed hypertension as a cause of uteroplacental insufficiency, with increased risk of IUGR, oligohydramnios, and stillbirth.  I told her that her hypertension also places her at increased risk for preeclampsia, describing the triad of increased blood pressure, proteinuria, and abnormal edema.  Finally, I spoke about hypertension increasing the risk of placental abruption, especially in the setting of superimposed preeclampsia.  We talked about the medical treatment of hypertension in pregnancy.  I outlined the different classes of medications, emphasizing that angiotensin enzyme inhibitors and angoitensin receptor blockers are contraindicated, and diuretics are relatively contraindicated.  I told her that beta-blockers and calcium channel blockers are commonly used to treat hypertension in pregnancy, and that both are felt to be safe for use in pregnancy.    Finally, I outlined the usual plan of management for hypertension in pregnancy.  She should have her blood pressure carefully followed, and only have initiation of antihypertensive medication to maintain BP in the target range of around 130-159/80-109 mm/Hg.    She has been appropriately scheduled for serial ultrasounds to plot interval growth every four weeks along with weekly biophysical profiles.  If she  develops preeclampsia, she should be delivered at 37 weeks in absence of severe features and 34 weeks in presence of severe features.  Finally, assuming all goes well, she should be delivered at around 38-[redacted] weeks gestation, if spontaneous labor does not occur before given this approach most fully minimizes risk for complications after 39 weeks and avoids the increased risk for cesarean section with expectant management beyond 39 weeks.    While the patient was given recommendation for ASA 81mg  po qd until 37 weeks to prevent preeclampsia, she has not been taking this owing to reading information on the internet that states to avoid taking aspirin or medications with aspirin in pregnancy.  I attempted to explain to her that full dose aspirin can cause alterations in amniotic fluid volume and place her fetus at risk for premature closure of the ductus arteriosus.  That being said, these events are not felt to occur and have not been reported with use of baby aspirin and that she was correctly recommended to take ASA 81mg  po qd due to it's potential benefit to prevent recurrent preeclampsia.  She indicated she would consider taking it now even though at this point she is well past 20 weeks and the benefit may be minimal (ie, limited data).  Summary of Recommendations: 1. ASA 81mg  po qd 2. Serial growth ultrasounds 3. Antenatal testing until delivery 4. Preeclampsia precautions paired with close surveillance by way of routine prenatal visits 5. Delivery 38-39 weeks in absence of indication in the interim.  Time Spent: I spent in excess of 30 minutes in consultation with this patient to review records, evaluate her case, and provide her with an adequate discussion and education.  More than 50% of this time was spent in direct face-to-face counseling.  It was a pleasure seeing your patient in the office today.  Thank you for consultation. Please do not hesitate to contact our service for any further  questions.  Rogelia Boga, MD, MS, [facility] Maternal-Fetal Medicine

## 2018-02-10 NOTE — Progress Notes (Signed)
Subjective:  Bethany KohutSelene Cruz is Cruz 22 y.o. G3P2002 at 5285w1d being seen today for ongoing prenatal care.  She is currently monitored for the following issues for this high-risk pregnancy and has Echogenic bowel of fetus on prenatal ultrasound; Chronic hypertension in pregnancy; Screening, antenatal for fetal growth retardation with ultrasound; Normal labor; Umbilical hernia; Supervision of normal pregnancy, antepartum; Hx of preeclampsia, prior pregnancy, currently pregnant; Pre-existing essential htn comp pregnancy, third trimester; and [redacted] weeks gestation of pregnancy on their problem list.  Patient reports vaginal irritation.  Contractions: Irritability. Vag. Bleeding: None.  Movement: Present. Denies leaking of fluid.   The following portions of the patient's history were reviewed and updated as appropriate: allergies, current medications, past family history, past medical history, past social history, past surgical history and problem list. Problem list updated.  Objective:   Vitals:   02/10/18 1409  BP: 127/81  Pulse: 90  Weight: 154 lb (69.9 kg)    Fetal Status: Fetal Heart Rate (bpm): 150   Movement: Present     General:  Alert, oriented and cooperative. Patient is in no acute distress.  Skin: Skin is warm and dry. No rash noted.   Cardiovascular: Normal heart rate noted  Respiratory: Normal respiratory effort, no problems with respiration noted  Abdomen: Soft, gravid, appropriate for gestational age. Pain/Pressure: Present     Pelvic:  Cervical exam deferred        Extremities: Normal range of motion.  Edema: None  Mental Status: Normal mood and affect. Normal behavior. Normal judgment and thought content.   Urinalysis:      Assessment and Plan:  Pregnancy: G3P2002 at 1885w1d  1. Supervision of other normal pregnancy, antepartum  2. Candida vaginitis Rx: - terconazole (TERAZOL 3) 0.8 % vaginal cream; Place 1 applicator vaginally at bedtime.  Dispense: 20 g; Refill: 0  Preterm  labor symptoms and general obstetric precautions including but not limited to vaginal bleeding, contractions, leaking of fluid and fetal movement were reviewed in detail with the patient. Please refer to After Visit Summary for other counseling recommendations.  Return in about 2 weeks (around 02/24/2018) for ROB.   Brock BadHarper, Bethany [place] A, MD

## 2018-02-10 NOTE — Progress Notes (Signed)
ROB Possible yeast infection again wants refill on Rx. Pressure off/ on none today.

## 2018-02-12 ENCOUNTER — Encounter: Payer: Medicaid Other | Admitting: Obstetrics

## 2018-02-17 ENCOUNTER — Ambulatory Visit (HOSPITAL_COMMUNITY)
Admission: RE | Admit: 2018-02-17 | Discharge: 2018-02-17 | Disposition: A | Discharge status: Home / Self Care | Payer: Medicaid Other | Source: Ambulatory Visit | Attending: Obstetrics | Admitting: Obstetrics

## 2018-02-17 ENCOUNTER — Other Ambulatory Visit: Payer: Self-pay | Admitting: Obstetrics

## 2018-02-17 DIAGNOSIS — Z8759 Personal history of other complications of pregnancy, childbirth and the puerperium: Secondary | ICD-10-CM

## 2018-02-17 DIAGNOSIS — O09299 Supervision of pregnancy with other poor reproductive or obstetric history, unspecified trimester: Secondary | ICD-10-CM | POA: Insufficient documentation

## 2018-02-17 DIAGNOSIS — Z3A31 31 weeks gestation of pregnancy: Secondary | ICD-10-CM | POA: Insufficient documentation

## 2018-02-17 DIAGNOSIS — O09293 Supervision of pregnancy with other poor reproductive or obstetric history, third trimester: Secondary | ICD-10-CM | POA: Insufficient documentation

## 2018-02-23 ENCOUNTER — Other Ambulatory Visit: Payer: Self-pay | Admitting: Obstetrics

## 2018-02-23 ENCOUNTER — Ambulatory Visit (HOSPITAL_COMMUNITY)
Admission: RE | Admit: 2018-02-23 | Discharge: 2018-02-23 | Disposition: A | Discharge status: Home / Self Care | Payer: Medicaid Other | Source: Ambulatory Visit | Attending: Certified Nurse Midwife | Admitting: Certified Nurse Midwife

## 2018-02-23 DIAGNOSIS — O10919 Unspecified pre-existing hypertension complicating pregnancy, unspecified trimester: Secondary | ICD-10-CM

## 2018-02-23 DIAGNOSIS — O10013 Pre-existing essential hypertension complicating pregnancy, third trimester: Secondary | ICD-10-CM | POA: Diagnosis not present

## 2018-02-23 DIAGNOSIS — Z8759 Personal history of other complications of pregnancy, childbirth and the puerperium: Secondary | ICD-10-CM

## 2018-02-23 DIAGNOSIS — O09293 Supervision of pregnancy with other poor reproductive or obstetric history, third trimester: Secondary | ICD-10-CM | POA: Insufficient documentation

## 2018-02-23 DIAGNOSIS — Z3A32 32 weeks gestation of pregnancy: Secondary | ICD-10-CM | POA: Diagnosis not present

## 2018-02-24 ENCOUNTER — Ambulatory Visit: Payer: Medicaid Other

## 2018-02-24 ENCOUNTER — Encounter: Payer: Medicaid Other | Admitting: Obstetrics & Gynecology

## 2018-02-24 ENCOUNTER — Telehealth: Payer: Self-pay | Admitting: *Deleted

## 2018-02-24 VITALS — BP 116/79 | HR 86 | Wt 155.6 lb

## 2018-02-24 DIAGNOSIS — Z0130 Encounter for examination of blood pressure without abnormal findings: Secondary | ICD-10-CM

## 2018-02-24 NOTE — Telephone Encounter (Signed)
ERROR

## 2018-02-24 NOTE — Progress Notes (Signed)
..  Subjective:  Bethany Cruz is a 22 y.o. female here for BP check.   Hypertension ROS: taking medications as instructed, no medication side effects noted, no TIA's, no chest pain on exertion, no dyspnea on exertion and no swelling of ankles.    Objective:  BP 116/79   Pulse 86   Wt 155 lb 9.6 oz (70.6 kg)   LMP 07/14/2017 (LMP Unknown)   BMI 25.11 kg/m   Appearance alert, well appearing, and in no distress. General exam BP noted to be well controlled today in office.    Assessment:   Blood Pressure well controlled.   Plan:  Current treatment plan is effective, no change in therapy..Marland Kitchen

## 2018-02-27 ENCOUNTER — Ambulatory Visit (HOSPITAL_COMMUNITY)
Admission: RE | Admit: 2018-02-27 | Discharge: 2018-02-27 | Disposition: A | Discharge status: Home / Self Care | Payer: Medicaid Other | Source: Ambulatory Visit | Attending: Obstetrics | Admitting: Obstetrics

## 2018-02-27 ENCOUNTER — Other Ambulatory Visit: Payer: Medicaid Other

## 2018-02-27 ENCOUNTER — Ambulatory Visit (INDEPENDENT_AMBULATORY_CARE_PROVIDER_SITE_OTHER): Payer: Medicaid Other | Admitting: Obstetrics and Gynecology

## 2018-02-27 VITALS — BP 109/74 | HR 80 | Wt 157.6 lb

## 2018-02-27 DIAGNOSIS — O1493 Unspecified pre-eclampsia, third trimester: Secondary | ICD-10-CM | POA: Diagnosis not present

## 2018-02-27 DIAGNOSIS — Z3A32 32 weeks gestation of pregnancy: Secondary | ICD-10-CM | POA: Diagnosis not present

## 2018-02-27 DIAGNOSIS — Z3480 Encounter for supervision of other normal pregnancy, unspecified trimester: Secondary | ICD-10-CM

## 2018-02-27 DIAGNOSIS — O0993 Supervision of high risk pregnancy, unspecified, third trimester: Secondary | ICD-10-CM | POA: Diagnosis not present

## 2018-02-27 DIAGNOSIS — Z8759 Personal history of other complications of pregnancy, childbirth and the puerperium: Secondary | ICD-10-CM

## 2018-02-27 DIAGNOSIS — O0990 Supervision of high risk pregnancy, unspecified, unspecified trimester: Secondary | ICD-10-CM

## 2018-02-27 NOTE — Progress Notes (Signed)
Prenatal Visit Note Date: 02/27/2018 Clinic: [facility]  Subjective:  Bethany KohutSelene Cruz is a 22 y.o. G3P2002 at 8012w4d being seen today for ongoing prenatal care.  She is currently monitored for the following issues for this high-risk pregnancy and has Chronic hypertension in pregnancy; Umbilical hernia; Supervision of high risk pregnancy, antepartum; H/O severe pre-eclampsia; and Pregnancy with poor obstetric history on their problem list.  Patient reports no complaints.   Contractions: Irritability. Vag. Bleeding: None.  Movement: Present. Denies leaking of fluid.   The following portions of the patient's history were reviewed and updated as appropriate: allergies, current medications, past family history, past medical history, past social history, past surgical history and problem list. Problem list updated.  Objective:   Vitals:   02/27/18 1055  BP: 109/74  Pulse: 80  Weight: 157 lb 9.6 oz (71.5 kg)    Fetal Status: Fetal Heart Rate (bpm): NST   Movement: Present     General:  Alert, oriented and cooperative. Patient is in no acute distress.  Skin: Skin is warm and dry. No rash noted.   Cardiovascular: Normal heart rate noted  Respiratory: Normal respiratory effort, no problems with respiration noted  Abdomen: Soft, gravid, appropriate for gestational age. Pain/Pressure: Present     Pelvic:  Cervical exam deferred        Extremities: Normal range of motion.  Edema: Trace  Mental Status: Normal mood and affect. Normal behavior. Normal judgment and thought content.   Urinalysis:      Assessment and Plan:  Pregnancy: G3P2002 at 3412w4d  1. Supervision of high risk pregnancy, antepartum Routine care. Nexplanon.  2. cHTN NST non reactive today (145 baseline, no accel, no decel, mod variability, toco quiet) x 1190m with repositioning, juice. No VAS available. bpp 8/8 on Monday. Will send for rpt bpp today and start qwk ones on Friday and schedule repeat growth for two weeks. iol at  39wks  Preterm labor symptoms and general obstetric precautions including but not limited to vaginal bleeding, contractions, leaking of fluid and fetal movement were reviewed in detail with the patient. Please refer to After Visit Summary for other counseling recommendations.  Return in about 2 weeks (around 03/13/2018).   [place] BingPickens, Laurier Jasperson, MD

## 2018-03-02 ENCOUNTER — Other Ambulatory Visit (HOSPITAL_COMMUNITY): Payer: Medicaid Other

## 2018-03-04 ENCOUNTER — Encounter: Payer: Medicaid Other | Admitting: Obstetrics and Gynecology

## 2018-03-16 ENCOUNTER — Ambulatory Visit (INDEPENDENT_AMBULATORY_CARE_PROVIDER_SITE_OTHER): Payer: Medicaid Other | Admitting: Obstetrics and Gynecology

## 2018-03-16 ENCOUNTER — Encounter: Payer: Self-pay | Admitting: Obstetrics and Gynecology

## 2018-03-16 VITALS — BP 131/87 | HR 89 | Wt 162.6 lb

## 2018-03-16 DIAGNOSIS — O0993 Supervision of high risk pregnancy, unspecified, third trimester: Secondary | ICD-10-CM

## 2018-03-16 DIAGNOSIS — O0990 Supervision of high risk pregnancy, unspecified, unspecified trimester: Secondary | ICD-10-CM

## 2018-03-16 DIAGNOSIS — O10913 Unspecified pre-existing hypertension complicating pregnancy, third trimester: Secondary | ICD-10-CM

## 2018-03-16 DIAGNOSIS — Z8759 Personal history of other complications of pregnancy, childbirth and the puerperium: Secondary | ICD-10-CM

## 2018-03-16 DIAGNOSIS — O10919 Unspecified pre-existing hypertension complicating pregnancy, unspecified trimester: Secondary | ICD-10-CM

## 2018-03-16 NOTE — Progress Notes (Signed)
   PRENATAL VISIT NOTE  Subjective:  Bethany Cruz is a 22 y.o. G3P2002 at 605w0d being seen today for ongoing prenatal care.  She is currently monitored for the following issues for this high-risk pregnancy and has Chronic hypertension in pregnancy; Umbilical hernia; Supervision of high risk pregnancy, antepartum; and H/O severe pre-eclampsia on their problem list.  Patient reports no complaints.  Contractions: Irritability. Vag. Bleeding: None.  Movement: Present. Denies leaking of fluid.   The following portions of the patient's history were reviewed and updated as appropriate: allergies, current medications, past family history, past medical history, past social history, past surgical history and problem list. Problem list updated.  Objective:   Vitals:   03/16/18 1302 03/16/18 1306  BP: (!) 141/89 131/87  Pulse: 89   Weight: 162 lb 9.6 oz (73.8 kg)     Fetal Status: Fetal Heart Rate (bpm): NST   Movement: Present     General:  Alert, oriented and cooperative. Patient is in no acute distress.  Skin: Skin is warm and dry. No rash noted.   Cardiovascular: Normal heart rate noted  Respiratory: Normal respiratory effort, no problems with respiration noted  Abdomen: Soft, gravid, appropriate for gestational age.  Pain/Pressure: Present     Pelvic: Cervical exam deferred        Extremities: Normal range of motion.  Edema: Trace  Mental Status: Normal mood and affect. Normal behavior. Normal judgment and thought content.   Assessment and Plan:  Pregnancy: G3P2002 at [redacted]w[redacted]d  1. Supervision of high risk pregnancy, antepartum Patient is doing well without complaints Cultures next visit  2. Chronic hypertension in pregnancy Normotensive without medication Continue ASA COntinue antenatal testing NST reviewed and reactive with baseline 140, mod variability, +accels, no decels BPP ordered weekly  3. H/O severe pre-eclampsia   Preterm labor symptoms and general obstetric  precautions including but not limited to vaginal bleeding, contractions, leaking of fluid and fetal movement were reviewed in detail with the patient. Please refer to After Visit Summary for other counseling recommendations.  Return in about 1 week (around 03/23/2018) for ROB, NST.  No future appointments.  Catalina AntiguaPeggy Tamme Mozingo, MD

## 2018-03-19 ENCOUNTER — Ambulatory Visit (HOSPITAL_COMMUNITY)
Admission: RE | Admit: 2018-03-19 | Discharge: 2018-03-19 | Disposition: A | Discharge status: Home / Self Care | Payer: Medicaid Other | Source: Ambulatory Visit | Attending: Obstetrics and Gynecology | Admitting: Obstetrics and Gynecology

## 2018-03-19 ENCOUNTER — Encounter (HOSPITAL_COMMUNITY): Payer: Self-pay

## 2018-03-19 ENCOUNTER — Other Ambulatory Visit: Payer: Self-pay | Admitting: Obstetrics and Gynecology

## 2018-03-19 VITALS — BP 127/78 | HR 78

## 2018-03-19 DIAGNOSIS — O09293 Supervision of pregnancy with other poor reproductive or obstetric history, third trimester: Secondary | ICD-10-CM | POA: Insufficient documentation

## 2018-03-19 DIAGNOSIS — O10919 Unspecified pre-existing hypertension complicating pregnancy, unspecified trimester: Secondary | ICD-10-CM

## 2018-03-19 DIAGNOSIS — Z3A35 35 weeks gestation of pregnancy: Secondary | ICD-10-CM

## 2018-03-19 DIAGNOSIS — Z362 Encounter for other antenatal screening follow-up: Secondary | ICD-10-CM | POA: Insufficient documentation

## 2018-03-19 DIAGNOSIS — O09299 Supervision of pregnancy with other poor reproductive or obstetric history, unspecified trimester: Secondary | ICD-10-CM

## 2018-03-19 DIAGNOSIS — O10913 Unspecified pre-existing hypertension complicating pregnancy, third trimester: Secondary | ICD-10-CM

## 2018-03-19 DIAGNOSIS — O10013 Pre-existing essential hypertension complicating pregnancy, third trimester: Secondary | ICD-10-CM | POA: Diagnosis present

## 2018-03-19 DIAGNOSIS — O163 Unspecified maternal hypertension, third trimester: Secondary | ICD-10-CM

## 2018-03-19 NOTE — Procedures (Signed)
Bethany Cruz 05/20/1996 3674w3d  Fetus A Non-Stress Test Interpretation for 03/19/18  Indication: Chronic Hypertenstion and Unsatisfactory BPP  Fetal Heart Rate A Mode: External Baseline Rate (A): 145 bpm Variability: Moderate Accelerations: 15 x 15 Decelerations: None Multiple birth?: No  Uterine Activity Mode: Toco Contraction Frequency (min): irreg UCC noted. Contraction Duration (sec): 40-90 Contraction Quality: Mild Resting Tone Palpated: Relaxed Resting Time: Adequate  Interpretation (Fetal Testing) Nonstress Test Interpretation: Reactive Comments: FHR tracing rev'd by Dr. Judeth CornfieldShankar

## 2018-03-23 ENCOUNTER — Ambulatory Visit (INDEPENDENT_AMBULATORY_CARE_PROVIDER_SITE_OTHER): Payer: Medicaid Other | Admitting: Obstetrics and Gynecology

## 2018-03-23 ENCOUNTER — Encounter: Payer: Self-pay | Admitting: Obstetrics and Gynecology

## 2018-03-23 ENCOUNTER — Other Ambulatory Visit (HOSPITAL_COMMUNITY)
Admission: RE | Admit: 2018-03-23 | Discharge: 2018-03-23 | Disposition: A | Discharge status: Home / Self Care | Payer: Medicaid Other | Source: Ambulatory Visit | Attending: Obstetrics and Gynecology | Admitting: Obstetrics and Gynecology

## 2018-03-23 VITALS — BP 147/97 | HR 100 | Wt 161.0 lb

## 2018-03-23 DIAGNOSIS — Z3A36 36 weeks gestation of pregnancy: Secondary | ICD-10-CM | POA: Diagnosis not present

## 2018-03-23 DIAGNOSIS — O0990 Supervision of high risk pregnancy, unspecified, unspecified trimester: Secondary | ICD-10-CM | POA: Diagnosis present

## 2018-03-23 DIAGNOSIS — O10913 Unspecified pre-existing hypertension complicating pregnancy, third trimester: Secondary | ICD-10-CM

## 2018-03-23 DIAGNOSIS — O10919 Unspecified pre-existing hypertension complicating pregnancy, unspecified trimester: Secondary | ICD-10-CM

## 2018-03-23 DIAGNOSIS — O0993 Supervision of high risk pregnancy, unspecified, third trimester: Secondary | ICD-10-CM | POA: Diagnosis not present

## 2018-03-23 LAB — OB RESULTS CONSOLE GC/CHLAMYDIA: Gonorrhea: NEGATIVE

## 2018-03-23 MED ORDER — FLUCONAZOLE 150 MG PO TABS: 150.0000 mg | ORAL_TABLET | Freq: Once | ORAL | 0 refills | Status: AC

## 2018-03-23 NOTE — Progress Notes (Signed)
   PRENATAL VISIT NOTE  Subjective:  Bethany Cruz is a 22 y.o. G3P2002 at 3350w0d being seen today for ongoing prenatal care.  She is currently monitored for the following issues for this high-risk pregnancy and has Chronic hypertension in pregnancy; Umbilical hernia; Supervision of high risk pregnancy, antepartum; and H/O severe pre-eclampsia on their problem list.  Patient reports no complaints.  Contractions: Irregular. Vag. Bleeding: None.  Movement: Present. Denies leaking of fluid.   The following portions of the patient's history were reviewed and updated as appropriate: allergies, current medications, past family history, past medical history, past social history, past surgical history and problem list. Problem list updated.  Objective:   Vitals:   03/23/18 1059 03/23/18 1101  BP: 136/89 (!) 147/97  Pulse: 93 100  Weight: 161 lb (73 kg)     Fetal Status: Fetal Heart Rate (bpm): NST   Movement: Present  Presentation: Vertex  General:  Alert, oriented and cooperative. Patient is in no acute distress.  Skin: Skin is warm and dry. No rash noted.   Cardiovascular: Normal heart rate noted  Respiratory: Normal respiratory effort, no problems with respiration noted  Abdomen: Soft, gravid, appropriate for gestational age.  Pain/Pressure: Present     Pelvic: Cervical exam performed Dilation: Closed Effacement (%): Thick Station: Ballotable  Extremities: Normal range of motion.  Edema: Trace  Mental Status: Normal mood and affect. Normal behavior. Normal judgment and thought content.   Assessment and Plan:  Pregnancy: G3P2002 at 8350w0d  1. Supervision of high risk pregnancy, antepartum Patient is doing well Cultures collected   2. Chronic hypertension in pregnancy Patient with borderline BP without symptoms Patient stopped ASA Normal growth 7/11 NST reviewed and reactive with baseline 140, mos variability, +accels, no decels BPP 7/18 Plan for IOL at 39 weeks  Preterm labor  symptoms and general obstetric precautions including but not limited to vaginal bleeding, contractions, leaking of fluid and fetal movement were reviewed in detail with the patient. Please refer to After Visit Summary for other counseling recommendations.  No follow-ups on file.  Future Appointments  Date Time Provider Department Center  03/23/2018  1:30 PM Nakita Santerre, Gigi GinPeggy, MD [facility] None  03/26/2018  1:00 PM [facility] US 5 [facility]  03/30/2018  1:30 PM Hermina StaggersErvin, Michael L, MD [facility] None  04/02/2018  1:15 PM [facility] US 4 [facility]  04/06/2018  2:15 PM Isayah Ignasiak, Gigi GinPeggy, MD [facility] None  04/09/2018  1:15 PM [facility] US 4 [facility]    Catalina AntiguaPeggy Moishe Schellenberg, MD

## 2018-03-24 LAB — CERVICOVAGINAL ANCILLARY ONLY
Chlamydia: NEGATIVE
Neisseria Gonorrhea: NEGATIVE

## 2018-03-25 LAB — STREP GP B NAA: STREP GROUP B AG: NEGATIVE

## 2018-03-26 ENCOUNTER — Ambulatory Visit (HOSPITAL_COMMUNITY)
Admission: RE | Admit: 2018-03-26 | Discharge: 2018-03-26 | Disposition: A | Discharge status: Home / Self Care | Payer: Medicaid Other | Source: Ambulatory Visit | Attending: Obstetrics and Gynecology | Admitting: Obstetrics and Gynecology

## 2018-03-26 ENCOUNTER — Encounter (HOSPITAL_COMMUNITY): Payer: Self-pay

## 2018-03-26 VITALS — BP 125/80 | HR 97

## 2018-03-26 DIAGNOSIS — O10913 Unspecified pre-existing hypertension complicating pregnancy, third trimester: Secondary | ICD-10-CM | POA: Diagnosis not present

## 2018-03-26 DIAGNOSIS — O09293 Supervision of pregnancy with other poor reproductive or obstetric history, third trimester: Secondary | ICD-10-CM | POA: Insufficient documentation

## 2018-03-26 DIAGNOSIS — O10013 Pre-existing essential hypertension complicating pregnancy, third trimester: Secondary | ICD-10-CM

## 2018-03-26 DIAGNOSIS — O10919 Unspecified pre-existing hypertension complicating pregnancy, unspecified trimester: Secondary | ICD-10-CM

## 2018-03-26 DIAGNOSIS — Z3A36 36 weeks gestation of pregnancy: Secondary | ICD-10-CM | POA: Diagnosis not present

## 2018-03-26 NOTE — Procedures (Signed)
Bethany Cruz 07/23/1996 758w3d  Fetus A Non-Stress Test Interpretation for 03/26/18  Indication: Unsatisfactory BPP  Fetal Heart Rate A Mode: External Baseline Rate (A): 135 bpm Variability: Moderate Accelerations: 15 x 15 Decelerations: None Multiple birth?: No  Uterine Activity Mode: Toco Contraction Frequency (min): none noted  Interpretation (Fetal Testing) Nonstress Test Interpretation: Reactive Comments: FHR tracing rev'd by Dr. Judeth CornfieldShankar

## 2018-03-30 ENCOUNTER — Telehealth (HOSPITAL_COMMUNITY): Payer: Self-pay | Admitting: *Deleted

## 2018-03-30 ENCOUNTER — Encounter: Payer: Self-pay | Admitting: Obstetrics and Gynecology

## 2018-03-30 ENCOUNTER — Ambulatory Visit (INDEPENDENT_AMBULATORY_CARE_PROVIDER_SITE_OTHER): Payer: Medicaid Other | Admitting: Obstetrics and Gynecology

## 2018-03-30 VITALS — BP 136/89 | HR 78 | Wt 163.0 lb

## 2018-03-30 DIAGNOSIS — O0990 Supervision of high risk pregnancy, unspecified, unspecified trimester: Secondary | ICD-10-CM

## 2018-03-30 DIAGNOSIS — O10913 Unspecified pre-existing hypertension complicating pregnancy, third trimester: Secondary | ICD-10-CM | POA: Diagnosis not present

## 2018-03-30 DIAGNOSIS — O0993 Supervision of high risk pregnancy, unspecified, third trimester: Secondary | ICD-10-CM

## 2018-03-30 DIAGNOSIS — Z8759 Personal history of other complications of pregnancy, childbirth and the puerperium: Secondary | ICD-10-CM

## 2018-03-30 DIAGNOSIS — O10919 Unspecified pre-existing hypertension complicating pregnancy, unspecified trimester: Secondary | ICD-10-CM

## 2018-03-30 NOTE — Telephone Encounter (Signed)
Preadmission screen  

## 2018-03-30 NOTE — Progress Notes (Signed)
Subjective:  Donzetta KohutSelene Mottola is a 22 y.o. G3P2002 at 4730w0d being seen today for ongoing prenatal care.  She is currently monitored for the following issues for this high-risk pregnancy and has Chronic hypertension in pregnancy; Umbilical hernia; Supervision of high risk pregnancy, antepartum; and H/O severe pre-eclampsia on their problem list.  Patient reports no complaints.  Contractions: Irregular. Vag. Bleeding: None.  Movement: Present. Denies leaking of fluid.   The following portions of the patient's history were reviewed and updated as appropriate: allergies, current medications, past family history, past medical history, past social history, past surgical history and problem list. Problem list updated.  Objective:   Vitals:   03/30/18 1341  BP: 136/89  Pulse: 78  Weight: 163 lb (73.9 kg)    Fetal Status: Fetal Heart Rate (bpm): nst   Movement: Present     General:  Alert, oriented and cooperative. Patient is in no acute distress.  Skin: Skin is warm and dry. No rash noted.   Cardiovascular: Normal heart rate noted  Respiratory: Normal respiratory effort, no problems with respiration noted  Abdomen: Soft, gravid, appropriate for gestational age. Pain/Pressure: Present     Pelvic:  Cervical exam performed        Extremities: Normal range of motion.  Edema: None  Mental Status: Normal mood and affect. Normal behavior. Normal judgment and thought content.   Urinalysis:      Assessment and Plan:  Pregnancy: G3P2002 at 8230w0d  1. Supervision of high risk pregnancy, antepartum Stable Labor precautions IOL scheduled at 39 weeks  2. H/O severe pre-eclampsia No S/Sx  3. Chronic hypertension in pregnancy BP stable NSt reactive today Continue with twice weekly testing  Term labor symptoms and general obstetric precautions including but not limited to vaginal bleeding, contractions, leaking of fluid and fetal movement were reviewed in detail with the patient. Please refer to  After Visit Summary for other counseling recommendations.  Return in about 1 week (around 04/06/2018) for OB visit.   Hermina StaggersErvin, Leaha Cuervo L, MD

## 2018-03-30 NOTE — Progress Notes (Signed)
Pt requests cx check today.  

## 2018-03-30 NOTE — Patient Instructions (Signed)
Vaginal Delivery Vaginal delivery means that you will give birth by pushing your baby out of your birth canal (vagina). A team of health care providers will help you before, during, and after vaginal delivery. Birth experiences are unique for every woman and every pregnancy, and birth experiences vary depending on where you choose to give birth. What should I do to prepare for my baby's birth? Before your baby is born, it is important to talk with your health care provider about:  Your labor and delivery preferences. These may include: ? Medicines that you may be given. ? How you will manage your pain. This might include non-medical pain relief techniques or injectable pain relief such as epidural analgesia. ? How you and your baby will be monitored during labor and delivery. ? Who may be in the labor and delivery room with you. ? Your feelings about surgical delivery of your baby (cesarean delivery, or C-section) if this becomes necessary. ? Your feelings about receiving donated blood through an IV tube (blood transfusion) if this becomes necessary.  Whether you are able: ? To take pictures or videos of the birth. ? To eat during labor and delivery. ? To move around, walk, or change positions during labor and delivery.  What to expect after your baby is born, such as: ? Whether delayed umbilical cord clamping and cutting is offered. ? Who will care for your baby right after birth. ? Medicines or tests that may be recommended for your baby. ? Whether breastfeeding is supported in your hospital or birth center. ? How long you will be in the hospital or birth center.  How any medical conditions you have may affect your baby or your labor and delivery experience.  To prepare for your baby's birth, you should also:  Attend all of your health care visits before delivery (prenatal visits) as recommended by your health care provider. This is important.  Prepare your home for your baby's  arrival. Make sure that you have: ? Diapers. ? Baby clothing. ? Feeding equipment. ? Safe sleeping arrangements for you and your baby.  Install a car seat in your vehicle. Have your car seat checked by a certified car seat installer to make sure that it is installed safely.  Think about who will help you with your new baby at home for at least the first several weeks after delivery.  What can I expect when I arrive at the birth center or hospital? Once you are in labor and have been admitted into the hospital or birth center, your health care provider may:  Review your pregnancy history and any concerns you have.  Insert an IV tube into one of your veins. This is used to give you fluids and medicines.  Check your blood pressure, pulse, temperature, and heart rate (vital signs).  Check whether your bag of water (amniotic sac) has broken (ruptured).  Talk with you about your birth plan and discuss pain control options.  Monitoring Your health care provider may monitor your contractions (uterine monitoring) and your baby's heart rate (fetal monitoring). You may need to be monitored:  Often, but not continuously (intermittently).  All the time or for long periods at a time (continuously). Continuous monitoring may be needed if: ? You are taking certain medicines, such as medicine to relieve pain or make your contractions stronger. ? You have pregnancy or labor complications.  Monitoring may be done by:  Placing a special stethoscope or a handheld monitoring device on your abdomen to   check your baby's heartbeat, and feeling your abdomen for contractions. This method of monitoring does not continuously record your baby's heartbeat or your contractions.  Placing monitors on your abdomen (external monitors) to record your baby's heartbeat and the frequency and length of contractions. You may not have to wear external monitors all the time.  Placing monitors inside of your uterus  (internal monitors) to record your baby's heartbeat and the frequency, length, and strength of your contractions. ? Your health care provider may use internal monitors if he or she needs more information about the strength of your contractions or your baby's heart rate. ? Internal monitors are put in place by passing a thin, flexible wire through your vagina and into your uterus. Depending on the type of monitor, it may remain in your uterus or on your baby's head until birth. ? Your health care provider will discuss the benefits and risks of internal monitoring with you and will ask for your permission before inserting the monitors.  Telemetry. This is a type of continuous monitoring that can be done with external or internal monitors. Instead of having to stay in bed, you are able to move around during telemetry. Ask your health care provider if telemetry is an option for you.  Physical exam Your health care provider may perform a physical exam. This may include:  Checking whether your baby is positioned: ? With the head toward your vagina (head-down). This is most common. ? With the head toward the top of your uterus (head-up or breech). If your baby is in a breech position, your health care provider may try to turn your baby to a head-down position so you can deliver vaginally. If it does not seem that your baby can be born vaginally, your provider may recommend surgery to deliver your baby. In rare cases, you may be able to deliver vaginally if your baby is head-up (breech delivery). ? Lying sideways (transverse). Babies that are lying sideways cannot be delivered vaginally.  Checking your cervix to determine: ? Whether it is thinning out (effacing). ? Whether it is opening up (dilating). ? How low your baby has moved into your birth canal.  What are the three stages of labor and delivery?  Normal labor and delivery is divided into the following three stages: Stage 1  Stage 1 is the  longest stage of labor, and it can last for hours or days. Stage 1 includes: ? Early labor. This is when contractions may be irregular, or regular and mild. Generally, early labor contractions are more than 10 minutes apart. ? Active labor. This is when contractions get longer, more regular, more frequent, and more intense. ? The transition phase. This is when contractions happen very close together, are very intense, and may last longer than during any other part of labor.  Contractions generally feel mild, infrequent, and irregular at first. They get stronger, more frequent (about every 2-3 minutes), and more regular as you progress from early labor through active labor and transition.  Many women progress through stage 1 naturally, but you may need help to continue making progress. If this happens, your health care provider may talk with you about: ? Rupturing your amniotic sac if it has not ruptured yet. ? Giving you medicine to help make your contractions stronger and more frequent.  Stage 1 ends when your cervix is completely dilated to 4 inches (10 cm) and completely effaced. This happens at the end of the transition phase. Stage 2  Once   your cervix is completely effaced and dilated to 4 inches (10 cm), you may start to feel an urge to push. It is common for the body to naturally take a rest before feeling the urge to push, especially if you received an epidural or certain other pain medicines. This rest period may last for up to 1-2 hours, depending on your unique labor experience.  During stage 2, contractions are generally less painful, because pushing helps relieve contraction pain. Instead of contraction pain, you may feel stretching and burning pain, especially when the widest part of your baby's head passes through the vaginal opening (crowning).  Your health care provider will closely monitor your pushing progress and your baby's progress through the vagina during stage 2.  Your  health care provider may massage the area of skin between your vaginal opening and anus (perineum) or apply warm compresses to your perineum. This helps it stretch as the baby's head starts to crown, which can help prevent perineal tearing. ? In some cases, an incision may be made in your perineum (episiotomy) to allow the baby to pass through the vaginal opening. An episiotomy helps to make the opening of the vagina larger to allow more room for the baby to fit through.  It is very important to breathe and focus so your health care provider can control the delivery of your baby's head. Your health care provider may have you decrease the intensity of your pushing, to help prevent perineal tearing.  After delivery of your baby's head, the shoulders and the rest of the body generally deliver very quickly and without difficulty.  Once your baby is delivered, the umbilical cord may be cut right away, or this may be delayed for 1-2 minutes, depending on your baby's health. This may vary among health care providers, hospitals, and birth centers.  If you and your baby are healthy enough, your baby may be placed on your chest or abdomen to help maintain the baby's temperature and to help you bond with each other. Some mothers and babies start breastfeeding at this time. Your health care team will dry your baby and help keep your baby warm during this time.  Your baby may need immediate care if he or she: ? Showed signs of distress during labor. ? Has a medical condition. ? Was born too early (prematurely). ? Had a bowel movement before birth (meconium). ? Shows signs of difficulty transitioning from being inside the uterus to being outside of the uterus. If you are planning to breastfeed, your health care team will help you begin a feeding. Stage 3  The third stage of labor starts immediately after the birth of your baby and ends after you deliver the placenta. The placenta is an organ that develops  during pregnancy to provide oxygen and nutrients to your baby in the womb.  Delivering the placenta may require some pushing, and you may have mild contractions. Breastfeeding can stimulate contractions to help you deliver the placenta.  After the placenta is delivered, your uterus should tighten (contract) and become firm. This helps to stop bleeding in your uterus. To help your uterus contract and to control bleeding, your health care provider may: ? Give you medicine by injection, through an IV tube, by mouth, or through your rectum (rectally). ? Massage your abdomen or perform a vaginal exam to remove any blood clots that are left in your uterus. ? Empty your bladder by placing a thin, flexible tube (catheter) into your bladder. ? Encourage   you to breastfeed your baby. After labor is over, you and your baby will be monitored closely to ensure that you are both healthy until you are ready to go home. Your health care team will teach you how to care for yourself and your baby. This information is not intended to replace advice given to you by your health care provider. Make sure you discuss any questions you have with your health care provider. Document Released: 06/04/2008 Document Revised: 03/15/2016 Document Reviewed: 09/10/2015 Elsevier Interactive Patient Education  2018 [employer]  

## 2018-03-31 ENCOUNTER — Other Ambulatory Visit: Payer: Self-pay | Admitting: Obstetrics and Gynecology

## 2018-03-31 DIAGNOSIS — O10919 Unspecified pre-existing hypertension complicating pregnancy, unspecified trimester: Secondary | ICD-10-CM

## 2018-03-31 DIAGNOSIS — O09299 Supervision of pregnancy with other poor reproductive or obstetric history, unspecified trimester: Secondary | ICD-10-CM

## 2018-03-31 DIAGNOSIS — Z3A35 35 weeks gestation of pregnancy: Secondary | ICD-10-CM

## 2018-03-31 DIAGNOSIS — O163 Unspecified maternal hypertension, third trimester: Secondary | ICD-10-CM

## 2018-04-01 ENCOUNTER — Telehealth (HOSPITAL_COMMUNITY): Payer: Self-pay | Admitting: *Deleted

## 2018-04-01 NOTE — Telephone Encounter (Signed)
Preadmission screen  

## 2018-04-02 ENCOUNTER — Ambulatory Visit (HOSPITAL_COMMUNITY)
Admission: RE | Admit: 2018-04-02 | Discharge: 2018-04-02 | Disposition: A | Discharge status: Home / Self Care | Payer: Medicaid Other | Source: Ambulatory Visit | Attending: Obstetrics and Gynecology | Admitting: Obstetrics and Gynecology

## 2018-04-02 ENCOUNTER — Other Ambulatory Visit: Payer: Self-pay | Admitting: Obstetrics and Gynecology

## 2018-04-02 DIAGNOSIS — O10919 Unspecified pre-existing hypertension complicating pregnancy, unspecified trimester: Secondary | ICD-10-CM

## 2018-04-02 DIAGNOSIS — O10913 Unspecified pre-existing hypertension complicating pregnancy, third trimester: Secondary | ICD-10-CM | POA: Insufficient documentation

## 2018-04-02 DIAGNOSIS — O10013 Pre-existing essential hypertension complicating pregnancy, third trimester: Secondary | ICD-10-CM | POA: Diagnosis not present

## 2018-04-02 DIAGNOSIS — O09293 Supervision of pregnancy with other poor reproductive or obstetric history, third trimester: Secondary | ICD-10-CM

## 2018-04-02 DIAGNOSIS — Z3A37 37 weeks gestation of pregnancy: Secondary | ICD-10-CM | POA: Diagnosis not present

## 2018-04-02 DIAGNOSIS — O09299 Supervision of pregnancy with other poor reproductive or obstetric history, unspecified trimester: Secondary | ICD-10-CM

## 2018-04-06 ENCOUNTER — Ambulatory Visit (INDEPENDENT_AMBULATORY_CARE_PROVIDER_SITE_OTHER): Payer: Medicaid Other | Admitting: Obstetrics and Gynecology

## 2018-04-06 ENCOUNTER — Encounter: Payer: Self-pay | Admitting: Obstetrics and Gynecology

## 2018-04-06 ENCOUNTER — Inpatient Hospital Stay (HOSPITAL_COMMUNITY): Admission: RE | Admit: 2018-04-06 | Payer: Medicaid Other | Source: Ambulatory Visit

## 2018-04-06 VITALS — BP 135/87 | HR 105 | Wt 163.7 lb

## 2018-04-06 DIAGNOSIS — O0990 Supervision of high risk pregnancy, unspecified, unspecified trimester: Secondary | ICD-10-CM

## 2018-04-06 DIAGNOSIS — O163 Unspecified maternal hypertension, third trimester: Secondary | ICD-10-CM | POA: Diagnosis not present

## 2018-04-06 DIAGNOSIS — O10913 Unspecified pre-existing hypertension complicating pregnancy, third trimester: Secondary | ICD-10-CM

## 2018-04-06 DIAGNOSIS — O10919 Unspecified pre-existing hypertension complicating pregnancy, unspecified trimester: Secondary | ICD-10-CM

## 2018-04-06 DIAGNOSIS — O0993 Supervision of high risk pregnancy, unspecified, third trimester: Secondary | ICD-10-CM

## 2018-04-06 NOTE — Progress Notes (Signed)
   PRENATAL VISIT NOTE  Subjective:  Bethany Cruz is a 22 y.o. G3P2002 at [redacted]w[redacted]d being seen today for ongoing prenatal care.  She is currently monitored for the following issues for this high-risk pregnancy and has Chronic hypertension in pregnancy; Umbilical hernia; Supervision of high risk pregnancy, antepartum; and H/O severe pre-eclampsia on their problem list.  Patient reports no complaints.  Contractions: Irregular. Vag. Bleeding: None.  Movement: Present. Denies leaking of fluid.   The following portions of the patient's history were reviewed and updated as appropriate: allergies, current medications, past family history, past medical history, past social history, past surgical history and problem list. Problem list updated.  Objective:   Vitals:   04/06/18 1427  BP: 135/87  Pulse: (!) 105  Weight: 163 lb 11.2 oz (74.3 kg)    Fetal Status: Fetal Heart Rate (bpm): NST    Movement: Present     General:  Alert, oriented and cooperative. Patient is in no acute distress.  Skin: Skin is warm and dry. No rash noted.   Cardiovascular: Normal heart rate noted  Respiratory: Normal respiratory effort, no problems with respiration noted  Abdomen: Soft, gravid, appropriate for gestational age.  Pain/Pressure: Present     Pelvic: Cervical exam deferred        Extremities: Normal range of motion.  Edema: None  Mental Status: Normal mood and affect. Normal behavior. Normal judgment and thought content.   Assessment and Plan:  Pregnancy: G3P2002 at [redacted]w[redacted]d  1. Chronic hypertension in pregnancy Stable without medication NST reviewed and reactive with baseline 140, mod variability, +accels, no decels BPP later this week IOL scheduled on 8/5 Patient to come in MAU on 8/4 for foley bulb placement  2. Supervision of high risk pregnancy, antepartum Patient is doing well without complaints  Preterm labor symptoms and general obstetric precautions including but not limited to vaginal  bleeding, contractions, leaking of fluid and fetal movement were reviewed in detail with the patient. Please refer to After Visit Summary for other counseling recommendations.  Return in about 6 weeks (around 05/18/2018) for postpartum visit.  Future Appointments  Date Time Provider Department Center  04/09/2018  1:15 PM [facility] US 4 [facility]US  04/13/2018  7:00 AM [facility] ROOM [facility] None    Catalina AntiguaPeggy Kent Riendeau, MD

## 2018-04-09 ENCOUNTER — Ambulatory Visit (HOSPITAL_COMMUNITY): Payer: Medicaid Other

## 2018-04-10 ENCOUNTER — Ambulatory Visit (HOSPITAL_COMMUNITY)
Admission: RE | Admit: 2018-04-10 | Discharge: 2018-04-10 | Disposition: A | Discharge status: Home / Self Care | Payer: Medicaid Other | Source: Ambulatory Visit | Attending: Obstetrics and Gynecology | Admitting: Obstetrics and Gynecology

## 2018-04-10 DIAGNOSIS — O09293 Supervision of pregnancy with other poor reproductive or obstetric history, third trimester: Secondary | ICD-10-CM | POA: Diagnosis not present

## 2018-04-10 DIAGNOSIS — O10913 Unspecified pre-existing hypertension complicating pregnancy, third trimester: Secondary | ICD-10-CM | POA: Insufficient documentation

## 2018-04-10 DIAGNOSIS — Z3A38 38 weeks gestation of pregnancy: Secondary | ICD-10-CM | POA: Insufficient documentation

## 2018-04-10 DIAGNOSIS — O10919 Unspecified pre-existing hypertension complicating pregnancy, unspecified trimester: Secondary | ICD-10-CM

## 2018-04-12 ENCOUNTER — Inpatient Hospital Stay (HOSPITAL_COMMUNITY)
Admission: AD | Admit: 2018-04-12 | Discharge: 2018-04-14 | DRG: 807 | Disposition: A | Discharge status: Home / Self Care | Payer: Medicaid Other | Attending: Obstetrics & Gynecology | Admitting: Obstetrics & Gynecology

## 2018-04-12 ENCOUNTER — Other Ambulatory Visit: Payer: Self-pay

## 2018-04-12 ENCOUNTER — Encounter (HOSPITAL_COMMUNITY): Payer: Self-pay

## 2018-04-12 DIAGNOSIS — S90861A Insect bite (nonvenomous), right foot, initial encounter: Secondary | ICD-10-CM | POA: Diagnosis present

## 2018-04-12 DIAGNOSIS — S90862A Insect bite (nonvenomous), left foot, initial encounter: Secondary | ICD-10-CM | POA: Diagnosis present

## 2018-04-12 DIAGNOSIS — S60562A Insect bite (nonvenomous) of left hand, initial encounter: Secondary | ICD-10-CM | POA: Diagnosis present

## 2018-04-12 DIAGNOSIS — Z3A38 38 weeks gestation of pregnancy: Secondary | ICD-10-CM | POA: Diagnosis not present

## 2018-04-12 DIAGNOSIS — O9902 Anemia complicating childbirth: Secondary | ICD-10-CM | POA: Diagnosis present

## 2018-04-12 DIAGNOSIS — O9989 Other specified diseases and conditions complicating pregnancy, childbirth and the puerperium: Secondary | ICD-10-CM | POA: Diagnosis present

## 2018-04-12 DIAGNOSIS — D649 Anemia, unspecified: Secondary | ICD-10-CM | POA: Diagnosis present

## 2018-04-12 DIAGNOSIS — O10919 Unspecified pre-existing hypertension complicating pregnancy, unspecified trimester: Secondary | ICD-10-CM | POA: Diagnosis present

## 2018-04-12 DIAGNOSIS — O1092 Unspecified pre-existing hypertension complicating childbirth: Secondary | ICD-10-CM | POA: Diagnosis not present

## 2018-04-12 DIAGNOSIS — Z3A39 39 weeks gestation of pregnancy: Secondary | ICD-10-CM | POA: Diagnosis not present

## 2018-04-12 DIAGNOSIS — O1002 Pre-existing essential hypertension complicating childbirth: Secondary | ICD-10-CM | POA: Diagnosis present

## 2018-04-12 HISTORY — DX: Encounter for other specified aftercare: Z51.89

## 2018-04-12 LAB — CBC
HCT: 32.9 % — ABNORMAL LOW (ref 36.0–46.0)
HEMOGLOBIN: 11.4 g/dL — AB (ref 12.0–15.0)
MCH: 31.6 pg (ref 26.0–34.0)
MCHC: 34.7 g/dL (ref 30.0–36.0)
MCV: 91.1 fL (ref 78.0–100.0)
Platelets: 123 10*3/uL — ABNORMAL LOW (ref 150–400)
RBC: 3.61 MIL/uL — ABNORMAL LOW (ref 3.87–5.11)
RDW: 14 % (ref 11.5–15.5)
WBC: 10.8 10*3/uL — ABNORMAL HIGH (ref 4.0–10.5)

## 2018-04-12 LAB — TYPE AND SCREEN
ABO/RH(D): O POS
Antibody Screen: NEGATIVE

## 2018-04-12 MED ORDER — OXYTOCIN 40 UNITS IN LACTATED RINGERS INFUSION - SIMPLE MED
1.0000 m[IU]/min | INTRAVENOUS | Status: DC
  Administered 2018-04-13: 2 m[IU]/min via INTRAVENOUS
  Filled 2018-04-12: qty 1000

## 2018-04-12 MED ORDER — LIDOCAINE HCL (PF) 1 % IJ SOLN
30.0000 mL | INTRAMUSCULAR | Status: DC | PRN
  Filled 2018-04-12: qty 30

## 2018-04-12 MED ORDER — MISOPROSTOL 50MCG HALF TABLET
50.0000 ug | ORAL_TABLET | ORAL | Status: DC
  Administered 2018-04-12: 50 ug via BUCCAL
  Filled 2018-04-12: qty 1

## 2018-04-12 MED ORDER — FENTANYL CITRATE (PF) 100 MCG/2ML IJ SOLN: 100.0000 ug | INTRAMUSCULAR | Status: DC | PRN

## 2018-04-12 MED ORDER — ACETAMINOPHEN 325 MG PO TABS: 650.0000 mg | ORAL_TABLET | ORAL | Status: DC | PRN

## 2018-04-12 MED ORDER — SODIUM CHLORIDE 0.9% FLUSH: 3.0000 mL | Freq: Two times a day (BID) | INTRAVENOUS | Status: DC

## 2018-04-12 MED ORDER — TERBUTALINE SULFATE 1 MG/ML IJ SOLN
0.2500 mg | Freq: Once | INTRAMUSCULAR | Status: DC | PRN
  Filled 2018-04-12: qty 1

## 2018-04-12 MED ORDER — LACTATED RINGERS IV SOLN
500.0000 mL | INTRAVENOUS | Status: DC | PRN
Start: 2018-04-12 — End: 2018-04-13

## 2018-04-12 MED ORDER — LACTATED RINGERS IV SOLN: INTRAVENOUS | Status: DC

## 2018-04-12 MED ORDER — SOD CITRATE-CITRIC ACID 500-334 MG/5ML PO SOLN: 30.0000 mL | ORAL | Status: DC | PRN

## 2018-04-12 MED ORDER — OXYTOCIN 40 UNITS IN LACTATED RINGERS INFUSION - SIMPLE MED
2.5000 [IU]/h | INTRAVENOUS | Status: DC
  Administered 2018-04-13: 2.5 [IU]/h via INTRAVENOUS

## 2018-04-12 MED ORDER — OXYTOCIN BOLUS FROM INFUSION
500.0000 mL | Freq: Once | INTRAVENOUS | Status: AC
  Administered 2018-04-13: 500 mL via INTRAVENOUS

## 2018-04-12 MED ORDER — ONDANSETRON HCL 4 MG/2ML IJ SOLN: 4.0000 mg | Freq: Four times a day (QID) | INTRAMUSCULAR | Status: DC | PRN

## 2018-04-12 MED ORDER — OXYCODONE-ACETAMINOPHEN 5-325 MG PO TABS: 1.0000 | ORAL_TABLET | ORAL | Status: DC | PRN

## 2018-04-12 MED ORDER — LIDOCAINE HCL (PF) 1 % IJ SOLN: 30.0000 mL | INTRAMUSCULAR | Status: DC | PRN

## 2018-04-12 MED ORDER — SODIUM CHLORIDE 0.9% FLUSH: 3.0000 mL | INTRAVENOUS | Status: DC | PRN

## 2018-04-12 MED ORDER — ONDANSETRON HCL 4 MG/2ML IJ SOLN
4.0000 mg | Freq: Four times a day (QID) | INTRAMUSCULAR | Status: DC | PRN
  Administered 2018-04-13: 4 mg via INTRAVENOUS
  Filled 2018-04-12: qty 2

## 2018-04-12 MED ORDER — SODIUM CHLORIDE 0.9 % IV SOLN: 250.0000 mL | INTRAVENOUS | Status: DC | PRN

## 2018-04-12 MED ORDER — LACTATED RINGERS IV SOLN
INTRAVENOUS | Status: DC
Start: 2018-04-12 — End: 2018-04-14
  Administered 2018-04-12: 19:00:00 via INTRAVENOUS

## 2018-04-12 MED ORDER — OXYTOCIN BOLUS FROM INFUSION: 500.0000 mL | Freq: Once | INTRAVENOUS | Status: DC

## 2018-04-12 MED ORDER — OXYTOCIN 40 UNITS IN LACTATED RINGERS INFUSION - SIMPLE MED: 2.5000 [IU]/h | INTRAVENOUS | Status: DC

## 2018-04-12 MED ORDER — ACETAMINOPHEN 325 MG PO TABS
650.0000 mg | ORAL_TABLET | ORAL | Status: DC | PRN
  Administered 2018-04-13: 650 mg via ORAL
  Filled 2018-04-12: qty 2

## 2018-04-12 MED ORDER — LACTATED RINGERS IV SOLN: 500.0000 mL | INTRAVENOUS | Status: DC | PRN

## 2018-04-12 MED ORDER — FENTANYL CITRATE (PF) 100 MCG/2ML IJ SOLN: 50.0000 ug | INTRAMUSCULAR | Status: DC | PRN

## 2018-04-12 MED ORDER — OXYCODONE-ACETAMINOPHEN 5-325 MG PO TABS: 2.0000 | ORAL_TABLET | ORAL | Status: DC | PRN
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
## 2018-04-12 NOTE — Anesthesia Pain Management Evaluation Note (Signed)
  [facility] Pain Management Visit Note  Patient: Bethany KohutSelene Hanke, 22 y.o., female  "Hello [facility] am a [facility]. We have an anesthesia team available at all times to provide care throughout the hospital, including epidural management and anesthesia for C-section. I don't know your plan for the delivery whether it a natural birth, water birth, IV sedation, nitrous supplementation, doula or epidural, but we want to meet your pain goals."   1.Was your pain managed to your expectations on prior hospitalizations?   Yes   2.What is your expectation for pain management during this hospitalization?     Epidural and IV pain meds  3.How can we help you reach that goal? Be available  Record the patient's initial score and the patient's pain goal.   Pain: 4  Pain Goal: 5 [facility] wants you to be able to say your pain was always managed very well.  [facility] Specialty HospitalMERRITT,Jayant Kriz 04/12/2018

## 2018-04-12 NOTE — Progress Notes (Signed)
LABOR PROGRESS NOTE  Bethany KohutSelene Cruz is a 22 y.o. G3P2002 at 4325w6d  admitted for IOL for CHTN. Late to prenatal care.   Subjective: Patient doing well, denies feeling contractions or cramping at this moment   Objective: BP (!) 144/75 (BP Location: Left Arm)   Pulse 83   Temp 98.7 F (37.1 C) (Oral)   Resp 18   Ht 5\' 6"  (1.676 m)   Wt 165 lb 12.8 oz (75.2 kg)   LMP 07/14/2017 (LMP Unknown)   BMI 26.76 kg/m  or  Vitals:   04/12/18 1757 04/12/18 1940 04/12/18 2017 04/12/18 2119  BP: 133/80 131/87 115/66 (!) 144/75  Pulse: 75 86 69 83  Resp: 18 18 18 18   Temp: 98.4 F (36.9 C) 98.7 F (37.1 C)    TempSrc: Oral Oral    Weight:      Height:        FB in place, placed at 1825  Dilation: 1.5 Effacement (%): 30 Cervical Position: Posterior Station: -3 Presentation: Vertex Exam by:: Bethany Cruz. Hernandez, RN FHT: baseline rate 130, moderate varibility, +acel, no decel Toco: 3-6 minutes   Labs: Lab Results  Component Value Date   WBC 10.8 (H) 04/12/2018   HGB 11.4 (L) 04/12/2018   HCT 32.9 (L) 04/12/2018   MCV 91.1 04/12/2018   PLT 123 (L) 04/12/2018    Patient Active Problem List   Diagnosis Date Noted  . Chronic hypertension affecting pregnancy 04/12/2018  . H/O severe pre-eclampsia   . Supervision of high risk pregnancy, antepartum 12/10/2017  . Umbilical hernia 12/07/2017  . Chronic hypertension in pregnancy     Assessment / Plan: 22 y.o. G3P2002 at 3825w6d here for IOL for CHTN   Labor: IOL with FB, Buccal Cytotec- until FB out  Fetal Wellbeing:  Cat I Pain Control:  Plans epidural, pain medication ordered PRN  Anticipated MOD:  SVD  Bethany CableRogers, Bethany Cruz C, CNM 04/12/2018, 9:49 PM

## 2018-04-12 NOTE — Progress Notes (Addendum)
G3P2 @ 38.[redacted] wksga. Here for FB placement. Denies LOF or bleeding. +FM  EFM applied.   1822: provider at bs assessing and for FB placement.   1837: FB placed. Copious amount of blood noted upon placement.   Orders received to admit pt for the bleeding.   1906 Labs drawn. IV placed.   1915: to birthing via wheelchair.

## 2018-04-12 NOTE — MAU Provider Note (Signed)
   PRENATAL VISIT NOTE  Subjective:  Bethany KohutSelene Cruz is a 22 y.o. G3P2002 at 4861w6d being seen today for outpatient Foley bulb placement for cervical ripening.  She is currently monitored for the following issues for this high-risk pregnancy and has Chronic hypertension in pregnancy; Umbilical hernia; Supervision of high risk pregnancy, antepartum; and H/O severe pre-eclampsia on their problem list.  Patient reports no complaints.   . Vag. Bleeding: None.   . Denies leaking of fluid.   The following portions of the patient's history were reviewed and updated as appropriate: allergies, current medications, past family history, past medical history, past social history, past surgical history and problem list. Problem list updated.  Objective:   Vitals:   04/12/18 1753  Weight: 165 lb 12.8 oz (75.2 kg)  Height: 5\' 6"  (1.676 m)    General:  Alert, oriented and cooperative. Patient is in no acute distress.  Skin: Skin is warm and dry. No rash noted.   Cardiovascular: Normal heart rate noted  Respiratory: Normal respiratory effort, no problems with respiration noted  Abdomen: Soft, gravid, appropriate for gestational age.        Pelvic: Cervical exam performed      1.5/30/-3, Vtx  Extremities: Normal range of motion.     Mental Status:  Normal mood and affect. Normal behavior. Normal judgment and thought content.  Procedure: Patient informed of R/B/A of procedure. NST was performed and was reactive prior to procedure. NST:  EFM: Baseline: 130 bpm, Variability: Good {> 6 bpm), Accelerations: Reactive and Decelerations: Absent Toco: rare, mild Procedure done to begin ripening of the cervix prior to admission for induction of labor. Appropriate time out taken. The patient was placed in the lithotomy position and the cervix brought into view with sterile speculum. A ring forcep was used to guide the 83F foley balloon through the internal os of the cervix. Cervix was noted to be friable w/ light  contact w/ speculum.  Foley Balloon filled with 60cc of sterile water. Plug inserted into end of the foley. Frank bleeding totaling  ~100 cc occurred after inflation of balloon. Speculum was removed. Digital exam performed verifying proper placement of foley. Scant addition bleeding.   FHR continued to be reactive. No abd pain.   MDM S/P Foley placement w/ frank vaginal bleeding. Not a candidate to send home for ripening.  Minimal additional bleeding while in MAU. Suspect bleeding is cervical. Low suspicion for placental source of bleeding since placental is anterior and above the os per anatomy US and foley was insterted posteriorly and not far into the uterus. Explained to patient that she is stable to continue induction as an in-patient. Will admit to L&D now.    Assessment and Plan:  Pregnancy: G3P2002 at 6461w6d CHTN S/P Foley placement w/ frank vaginal bleeding, stable.   Admit for IOL Report given to Zerita Boersarlene Lawson. CNM and Dr. Erin FullingHarraway-Brycen Bean.  [place] Brittanya Winburn, [place] 04/12/2018 7:55 PM

## 2018-04-13 ENCOUNTER — Inpatient Hospital Stay (HOSPITAL_COMMUNITY): Payer: Medicaid Other | Admitting: Anesthesiology

## 2018-04-13 ENCOUNTER — Inpatient Hospital Stay (HOSPITAL_COMMUNITY): Admission: RE | Admit: 2018-04-13 | Payer: Medicaid Other | Source: Ambulatory Visit

## 2018-04-13 ENCOUNTER — Encounter (HOSPITAL_COMMUNITY): Payer: Self-pay

## 2018-04-13 DIAGNOSIS — O1092 Unspecified pre-existing hypertension complicating childbirth: Secondary | ICD-10-CM

## 2018-04-13 DIAGNOSIS — Z3A39 39 weeks gestation of pregnancy: Secondary | ICD-10-CM

## 2018-04-13 LAB — CBC WITH DIFFERENTIAL/PLATELET
BASOS ABS: 0 10*3/uL (ref 0.0–0.1)
BASOS PCT: 0 %
Eosinophils Absolute: 0 10*3/uL (ref 0.0–0.7)
Eosinophils Relative: 0 %
HCT: 31.1 % — ABNORMAL LOW (ref 36.0–46.0)
HEMOGLOBIN: 10.8 g/dL — AB (ref 12.0–15.0)
LYMPHS ABS: 2.3 10*3/uL (ref 0.7–4.0)
Lymphocytes Relative: 22 %
MCH: 32 pg (ref 26.0–34.0)
MCHC: 34.7 g/dL (ref 30.0–36.0)
MCV: 92 fL (ref 78.0–100.0)
Monocytes Absolute: 0.5 10*3/uL (ref 0.1–1.0)
Monocytes Relative: 5 %
NEUTROS PCT: 73 %
Neutro Abs: 7.5 10*3/uL (ref 1.7–7.7)
PLATELETS: 104 10*3/uL — AB (ref 150–400)
RBC: 3.38 MIL/uL — AB (ref 3.87–5.11)
RDW: 14 % (ref 11.5–15.5)
WBC: 10.4 10*3/uL (ref 4.0–10.5)

## 2018-04-13 LAB — RPR: RPR: NONREACTIVE

## 2018-04-13 MED ORDER — MISOPROSTOL 200 MCG PO TABS
400.0000 ug | ORAL_TABLET | Freq: Once | ORAL | Status: AC
  Administered 2018-04-13: 400 ug via BUCCAL

## 2018-04-13 MED ORDER — ZOLPIDEM TARTRATE 5 MG PO TABS: 5.0000 mg | ORAL_TABLET | Freq: Every evening | ORAL | Status: DC | PRN

## 2018-04-13 MED ORDER — MISOPROSTOL 200 MCG PO TABS
ORAL_TABLET | ORAL | Status: AC
  Administered 2018-04-13: 400 ug via BUCCAL
  Filled 2018-04-13: qty 4

## 2018-04-13 MED ORDER — BENZOCAINE-MENTHOL 20-0.5 % EX AERO: 1.0000 "application " | INHALATION_SPRAY | CUTANEOUS | Status: DC | PRN

## 2018-04-13 MED ORDER — COCONUT OIL OIL: 1.0000 "application " | TOPICAL_OIL | Status: DC | PRN

## 2018-04-13 MED ORDER — EPHEDRINE 5 MG/ML INJ
10.0000 mg | INTRAVENOUS | Status: DC | PRN
  Filled 2018-04-13: qty 2

## 2018-04-13 MED ORDER — DIPHENHYDRAMINE HCL 25 MG PO CAPS: 25.0000 mg | ORAL_CAPSULE | Freq: Four times a day (QID) | ORAL | Status: DC | PRN

## 2018-04-13 MED ORDER — PHENYLEPHRINE 40 MCG/ML (10ML) SYRINGE FOR IV PUSH (FOR BLOOD PRESSURE SUPPORT)
80.0000 ug | PREFILLED_SYRINGE | INTRAVENOUS | Status: DC | PRN
  Filled 2018-04-13: qty 5

## 2018-04-13 MED ORDER — PRENATAL MULTIVITAMIN CH
1.0000 | ORAL_TABLET | Freq: Every day | ORAL | Status: DC
  Administered 2018-04-13 – 2018-04-14 (×2): 1 via ORAL
  Filled 2018-04-13 (×2): qty 1

## 2018-04-13 MED ORDER — SIMETHICONE 80 MG PO CHEW: 80.0000 mg | CHEWABLE_TABLET | ORAL | Status: DC | PRN

## 2018-04-13 MED ORDER — ACETAMINOPHEN 325 MG PO TABS
650.0000 mg | ORAL_TABLET | ORAL | Status: DC | PRN
  Administered 2018-04-13: 650 mg via ORAL
  Filled 2018-04-13: qty 2

## 2018-04-13 MED ORDER — LACTATED RINGERS AMNIOINFUSION
INTRAVENOUS | Status: DC
  Administered 2018-04-13: 03:00:00 via INTRAUTERINE
  Filled 2018-04-13 (×2): qty 1000

## 2018-04-13 MED ORDER — MISOPROSTOL 200 MCG PO TABS: 400.0000 ug | ORAL_TABLET | Freq: Once | ORAL | Status: DC

## 2018-04-13 MED ORDER — WITCH HAZEL-GLYCERIN EX PADS: 1.0000 "application " | MEDICATED_PAD | CUTANEOUS | Status: DC | PRN

## 2018-04-13 MED ORDER — DIBUCAINE 1 % RE OINT: 1.0000 "application " | TOPICAL_OINTMENT | RECTAL | Status: DC | PRN

## 2018-04-13 MED ORDER — SENNOSIDES-DOCUSATE SODIUM 8.6-50 MG PO TABS
2.0000 | ORAL_TABLET | ORAL | Status: DC
  Filled 2018-04-13: qty 2

## 2018-04-13 MED ORDER — IBUPROFEN 600 MG PO TABS
600.0000 mg | ORAL_TABLET | Freq: Four times a day (QID) | ORAL | Status: DC
  Administered 2018-04-13 – 2018-04-14 (×6): 600 mg via ORAL
  Filled 2018-04-13 (×6): qty 1

## 2018-04-13 MED ORDER — HYDROCORTISONE 1 % EX CREA
TOPICAL_CREAM | Freq: Three times a day (TID) | CUTANEOUS | Status: DC | PRN
  Filled 2018-04-13: qty 28

## 2018-04-13 MED ORDER — ONDANSETRON HCL 4 MG/2ML IJ SOLN: 4.0000 mg | INTRAMUSCULAR | Status: DC | PRN

## 2018-04-13 MED ORDER — LIDOCAINE HCL (PF) 1 % IJ SOLN
INTRAMUSCULAR | Status: DC | PRN
  Administered 2018-04-13: 4 mL via EPIDURAL
  Administered 2018-04-13: 6 mL via EPIDURAL

## 2018-04-13 MED ORDER — PHENYLEPHRINE 40 MCG/ML (10ML) SYRINGE FOR IV PUSH (FOR BLOOD PRESSURE SUPPORT)
80.0000 ug | PREFILLED_SYRINGE | INTRAVENOUS | Status: DC | PRN
  Filled 2018-04-13: qty 10
  Filled 2018-04-13: qty 5

## 2018-04-13 MED ORDER — FENTANYL 2.5 MCG/ML BUPIVACAINE 1/10 % EPIDURAL INFUSION (WH - ANES)
14.0000 mL/h | INTRAMUSCULAR | Status: DC | PRN
  Administered 2018-04-13: 14 mL/h via EPIDURAL
  Filled 2018-04-13: qty 100

## 2018-04-13 MED ORDER — DIPHENHYDRAMINE HCL 50 MG/ML IJ SOLN: 12.5000 mg | INTRAMUSCULAR | Status: DC | PRN

## 2018-04-13 MED ORDER — HYDROCORTISONE 1 % EX LOTN
TOPICAL_LOTION | Freq: Three times a day (TID) | CUTANEOUS | Status: DC | PRN
  Filled 2018-04-13: qty 118

## 2018-04-13 MED ORDER — ONDANSETRON HCL 4 MG PO TABS: 4.0000 mg | ORAL_TABLET | ORAL | Status: DC | PRN

## 2018-04-13 MED ORDER — LACTATED RINGERS IV SOLN
500.0000 mL | Freq: Once | INTRAVENOUS | Status: AC
  Administered 2018-04-13: 1000 mL via INTRAVENOUS

## 2018-04-13 MED ORDER — TETANUS-DIPHTH-ACELL PERTUSSIS 5-2.5-18.5 LF-MCG/0.5 IM SUSP: 0.5000 mL | Freq: Once | INTRAMUSCULAR | Status: DC

## 2018-04-13 NOTE — Progress Notes (Signed)
Mom complaining of bumps coming up on her, her baby daddy and older child since she was admitted    On hands and feet  md notified

## 2018-04-13 NOTE — Lactation Note (Signed)
This note was copied from a baby's chart. Lactation Consultation Note  Patient Name: Girl Donzetta KohutSelene Quesinberry PJKDT'OToday's Date: 04/13/2018 Reason for consult: Initial assessment;Term Breastfeeding consultation services and support information given to patient.  This is mom's third baby and she breastfed previous babies for 16 and 21 months.  Newborn is 79 hours old and per mom latching with ease.  Baby currently sleepy.  Recommended skin to skin and watching for feeding cues.  Encouraged to call for assist/concerns prn.  Maternal Data Has patient been taught Hand Expression?: Yes Does the patient have breastfeeding experience prior to this delivery?: Yes  Feeding Feeding Type: Breast Fed  LATCH Score Latch: Repeated attempts needed to sustain latch, nipple held in mouth throughout feeding, stimulation needed to elicit sucking reflex.  Audible Swallowing: A few with stimulation  Type of Nipple: Everted at rest and after stimulation  Comfort (Breast/Nipple): Soft / non-tender  Hold (Positioning): Assistance needed to correctly position infant at breast and maintain latch.  LATCH Score: 7  Interventions    Lactation Tools Discussed/Used     Consult Status Consult Status: Follow-up Date: 04/14/18 Follow-up type: In-patient    Huston FoleyMOULDEN, Bich Mchaney S 04/13/2018, 1:18 PM

## 2018-04-13 NOTE — Progress Notes (Signed)
LABOR PROGRESS NOTE  Donzetta KohutSelene Rullo is a 22 y.o. G3P2002 at 7572w0d  admitted for IOL for CHTN  Subjective: Patient breathing through contractions, requesting epidural   Objective: BP 124/76 (BP Location: Left Arm)   Pulse 77   Temp 98.7 F (37.1 C) (Oral)   Resp 18   Ht 5\' 6"  (1.676 m)   Wt 165 lb 12.8 oz (75.2 kg)   LMP 07/14/2017 (LMP Unknown)   SpO2 100%   BMI 26.76 kg/m  or  Vitals:   04/13/18 0135 04/13/18 0140 04/13/18 0210 04/13/18 0215  BP:      Pulse:      Resp:      Temp:      TempSrc:      SpO2: 100% 100% 100% 100%  Weight:      Height:        SROM @0150 - clear fluid  Dilation: 3.5 Effacement (%): 60 Cervical Position: Posterior Station: -3 Presentation: Vertex Exam by:: Casey Burkitt. Hernandez, RN FHT: baseline rate 140, moderate varibility, +acel, early decel Toco: 3-4/ moderate by palpation   Labs: Lab Results  Component Value Date   WBC 10.4 04/13/2018   HGB 10.8 (L) 04/13/2018   HCT 31.1 (L) 04/13/2018   MCV 92.0 04/13/2018   PLT PENDING 04/13/2018    Patient Active Problem List   Diagnosis Date Noted  . Chronic hypertension affecting pregnancy 04/12/2018  . H/O severe pre-eclampsia   . Supervision of high risk pregnancy, antepartum 12/10/2017  . Umbilical hernia 12/07/2017  . Chronic hypertension in pregnancy     Assessment / Plan: 22 y.o. G3P2002 at 5772w0d here for IOL for CHTN   Labor: S/P FB and Cytotec, SROM@0150 , pitocin initiated at 0100- currently on 2 milli-unit/minute, continue pitocin titration to active labor  Fetal Wellbeing:  Cat I Pain Control:  Plans epidural  Anticipated MOD:  SVD  Sharyon CableRogers, Nikol Lemar C, CNM 04/13/2018, 2:22 AM

## 2018-04-13 NOTE — Plan of Care (Signed)
Education complete. Pt verbalized understanding.   Theone Murdochourtney Keivon Garden, RN

## 2018-04-13 NOTE — Anesthesia Procedure Notes (Signed)
Epidural Patient location during procedure: OB Start time: 04/13/2018 2:43 AM End time: 04/13/2018 2:47 AM  Staffing Anesthesiologist: Beryle LatheBrock, Thomas E, MD Performed: anesthesiologist   Preanesthetic Checklist Completed: patient identified, pre-op evaluation, timeout performed, IV checked, risks and benefits discussed and monitors and equipment checked  Epidural Patient position: sitting Prep: DuraPrep Patient monitoring: continuous pulse ox and blood pressure Approach: midline Location: L3-L4 Injection technique: LOR saline  Needle:  Needle type: Tuohy  Needle gauge: 17 G Needle length: 9 cm Needle insertion depth: 5 cm Catheter size: 19 Gauge Catheter at skin depth: 10 cm Test dose: negative and Other (1% lidocaine)  Additional Notes Patient identified. Risks including, but not limited to, bleeding, infection, nerve damage, paralysis, inadequate analgesia, blood pressure changes, nausea, vomiting, allergic reaction, postpartum back pain, itching, and headache were discussed. Patient expressed understanding and wished to proceed. Sterile prep and drape, including hand hygiene, mask, and sterile gloves were used. The patient was positioned and the spine was prepped. The skin was anesthetized with lidocaine. No paraesthesia or other complication noted. The patient did not experience any signs of intravascular injection such as tinnitus or metallic taste in mouth, nor signs of intrathecal spread such as rapid motor block. Please see nursing notes for vital signs. The patient tolerated the procedure well.   Leslye Peerhomas Brock, MDReason for block:procedure for pain

## 2018-04-13 NOTE — Progress Notes (Signed)
Patient ID: Bethany KohutSelene Alton, female   DOB: 03/03/1996, 22 y.o.   MRN: [id number]  S: I was notified by RN of pt itching and rash on hands and feet.  I came to room at 9 pm and evaluated the pt. She reports noticing 3-4 raised red areas with pruritis in those areas only, starting yesterday.  She has one area on her left smallest finger and the rest are on the tops of her feet and on one toe. There is no involvement between fingers or toes and no pruritis outside of the isolated areas of raised erythemetous skin, all less than 0.5 cm in diameter.  She does report similar symptoms with her husband and son.  She reports they were all at a bonfire outside the day before yesterday and the bites are similar to mosquito bites she has had in the past.  There are no other associated symptoms.  She has used the hydrocortisone cream prescribed earlier today and it does help.    O: BP 117/70 (BP Location: Right Arm)   Pulse 73   Temp 98.6 F (37 C) (Oral)   Resp 20   Ht 5\' 6"  (1.676 m)   Wt 165 lb 12.8 oz (75.2 kg)   LMP 07/14/2017 (LMP Unknown)   SpO2 100%   Breastfeeding? Unknown   BMI 26.76 kg/m    3-4 papules, all less than 0.5 cm in size, one on left smallest finger and the rest on her upper foot/toe. No other visible rash is seen.    A: Insect bites, likely mosquito No evidence of scabies  P: Continue hydrocortisone cream PRN Notify RN if symptoms worsen  Sharen CounterLisa Leftwich-Kirby, CNM 9:08 PM

## 2018-04-13 NOTE — Progress Notes (Signed)
Using hydrocotisone for itchy bumps

## 2018-04-13 NOTE — Anesthesia Preprocedure Evaluation (Addendum)
Anesthesia Evaluation  Patient identified by MRN, date of birth, ID band Patient awake    Reviewed: Allergy & Precautions, NPO status , Patient's Chart, lab work & pertinent test results  History of Anesthesia Complications Negative for: history of anesthetic complications  Airway Mallampati: II  TM Distance: >3 FB Neck ROM: Full    Dental   Pulmonary neg pulmonary ROS,    breath sounds clear to auscultation       Cardiovascular hypertension (PIH),  Rhythm:Regular Rate:Normal     Neuro/Psych negative neurological ROS  negative psych ROS   GI/Hepatic negative GI ROS, Neg liver ROS,   Endo/Other  negative endocrine ROS  Renal/GU negative Renal ROS  negative genitourinary   Musculoskeletal negative musculoskeletal ROS (+)   Abdominal   Peds  Hematology  (+) anemia ,  Thrombocytopenia   Anesthesia Other Findings   Reproductive/Obstetrics (+) Pregnancy                            Anesthesia Physical Anesthesia Plan  ASA: II  Anesthesia Plan: Epidural   Post-op Pain Management:    Induction:   PONV Risk Score and Plan: 2 and Treatment may vary due to age or medical condition  Airway Management Planned: Natural Airway  Additional Equipment: None  Intra-op Plan:   Post-operative Plan:   Informed Consent: I have reviewed the patients History and Physical, chart, labs and discussed the procedure including the risks, benefits and alternatives for the proposed anesthesia with the patient or authorized representative who has indicated his/her understanding and acceptance.     Plan Discussed with: Anesthesiologist  Anesthesia Plan Comments: (Labs reviewed. Platelets low, but acceptable, patient not taking any blood thinning medications. Per RN, FHR tracing reported to be stable enough for sitting procedure. Risks and benefits discussed with patient, including PDPH, epidural hematoma,  backache, failed epidural, allergic reaction, and nerve injury. Patient expressed understanding and wished to proceed.)       Anesthesia Quick Evaluation

## 2018-04-13 NOTE — Anesthesia Postprocedure Evaluation (Signed)
Anesthesia Post Note  Patient: Navie Boateng  Procedure(s) Performed: AN AD HOC LABOR EPIDURAL     Patient location during evaluation: Mother Baby Anesthesia Type: Epidural Level of consciousness: awake and alert and oriented Pain management: satisfactory to patient Vital Signs Assessment: post-procedure vital signs reviewed and stable Respiratory status: spontaneous breathing and nonlabored ventilation Cardiovascular status: stable Postop Assessment: no headache, no backache, no signs of nausea or vomiting, adequate PO intake and patient able to bend at knees (patient up walking) Anesthetic complications: no    Last Vitals:  Vitals:   04/13/18 0555 04/13/18 0633  BP:  119/74  Pulse:  87  Resp:  16  Temp:  37.7 C  SpO2: 100% 100%    Last Pain:  Vitals:   04/13/18 0735  TempSrc:   PainSc: 3    Pain Goal: Patients Stated Pain Goal: 2 (04/12/18 2300)               Madison HickmanGREGORY,Ahlayah Tarkowski

## 2018-04-14 MED ORDER — IBUPROFEN 600 MG PO TABS: 600.0000 mg | ORAL_TABLET | Freq: Four times a day (QID) | ORAL | 0 refills | Status: AC

## 2018-04-14 MED ORDER — SENNOSIDES-DOCUSATE SODIUM 8.6-50 MG PO TABS: 2.0000 | ORAL_TABLET | ORAL | 0 refills | Status: AC

## 2018-04-14 NOTE — Discharge Summary (Signed)
OB Discharge Summary     Patient Name: Bethany Cruz DOB: [birth year] MRN: [id number]  Date of admission: 04/12/2018 Delivering MD: Sharyon Cable   Date of discharge: 04/14/2018  Admitting diagnosis: 46 WKS, FOLLEY Intrauterine pregnancy: [redacted]w[redacted]d     Secondary diagnosis:  Active Problems:   Chronic hypertension affecting pregnancy   SVD (spontaneous vaginal delivery)      Discharge diagnosis: Term Pregnancy Delivered                                                                                                Post partum procedures:None  Augmentation: Pitocin, Cytotec and Foley Balloon  Complications: 800 cc blood loss, cytotec PP given   Hospital course:  Induction of Labor With Vaginal Delivery   22 y.o. yo G3P3003 at [redacted]w[redacted]d was admitted to the hospital 04/12/2018 for induction of labor.  Indication for induction: cHTN.  Patient had an uncomplicated labor course as follows: Membrane Rupture Time/Date: 1:50 AM ,04/13/2018   Intrapartum Procedures: Episiotomy: None [1]                                         Lacerations:  1st degree [2]  Patient had delivery of a Viable infant.  Information for the patient's newborn:  Alayah, Knouff [[id number]]  Delivery Method: Vag-Spont   04/13/2018  Details of delivery can be found in separate delivery note.  Patient had a routine postpartum course. Patient is discharged home 04/14/18.  Physical exam  Vitals:   04/13/18 1337 04/13/18 1631 04/13/18 2308 04/14/18 0535  BP: 118/62 117/70 132/78 109/79  Pulse: 64 73 76 81  Resp: 18 20 16 18   Temp: 98.3 F (36.8 C) 98.6 F (37 C) 98.1 F (36.7 C) 97.9 F (36.6 C)  TempSrc: Oral Oral Oral Oral  SpO2:   100%   Weight:      Height:       General: alert, cooperative and no distress Lochia: appropriate Uterine Fundus: firm Incision: N/A DVT Evaluation: No evidence of DVT seen on physical exam. Labs: Lab Results  Component Value Date   WBC 10.4 04/13/2018   HGB 10.8 (L)  04/13/2018   HCT 31.1 (L) 04/13/2018   MCV 92.0 04/13/2018   PLT 104 (L) 04/13/2018   CMP Latest Ref Rng & Units 11/23/2014  Glucose 70 - 99 mg/dL 89  BUN 6 - 23 mg/dL 10  Creatinine 7.82 - 9.56 mg/dL 2.13  Sodium 086 - 578 mmol/L 137  Potassium 3.5 - 5.1 mmol/L 3.8  Chloride 96 - 112 mmol/L 106  CO2 19 - 32 mmol/L 24  Calcium 8.4 - 10.5 mg/dL 9.0  Total Protein 6.0 - 8.3 g/dL 7.1  Total Bilirubin 0.3 - 1.2 mg/dL 0.8  Alkaline Phos 39 - 117 U/L 185(H)  AST 0 - 37 U/L 26  ALT 0 - 35 U/L 17    Discharge instruction: per After Visit Summary and "Baby and Me Booklet".  After visit meds:  Allergies as of 04/14/2018  No Known Allergies     Medication List    TAKE these medications   calcium carbonate 500 MG chewable tablet Commonly known as:  TUMS - dosed in mg elemental calcium Chew 1 tablet by mouth 4 (four) times daily as needed for indigestion or heartburn.   ibuprofen 600 MG tablet Commonly known as:  ADVIL,MOTRIN Take 1 tablet (600 mg total) by mouth every 6 (six) hours.   senna-docusate 8.6-50 MG tablet Commonly known as:  Senokot-S Take 2 tablets by mouth daily. Start taking on:  04/15/2018   VITAFOL ULTRA 29-0.6-0.4-200 MG Caps Take 1 capsule by mouth daily before breakfast.       Diet: routine diet  Activity: Advance as tolerated. Pelvic rest for 6 weeks.   Outpatient follow up:1 week for BP check, 4 weeks PP follow up Follow up Appt: Future Appointments  Date Time Provider Department Center  04/20/2018  1:15 PM [facility] NURSE [facility] None  05/14/2018  1:30 PM Constant, Peggy, MD [facility] None   Follow up Visit:No follow-ups on file.  Postpartum contraception: Nexplanon  Newborn Data: Live born female  Birth Weight: 6 lb 9.3 oz (2985 g) APGAR: 9, 9  Newborn Delivery   Time head delivered:  04/13/2018 03:42:49 Birth date/time:  04/13/2018 03:42:00 Delivery type:  Vaginal, Spontaneous     Baby Feeding: Breast Disposition:home with  mother   04/14/2018 De Hollingsheadatherine L Eyan Hagood, DO

## 2018-04-14 NOTE — Progress Notes (Signed)
Post Partum Day 1 Subjective: no complaints, voiding and BMs  Objective: Blood pressure 109/79, pulse 81, temperature 97.9 F (36.6 C), temperature source Oral, resp. rate 18, height 5\' 6"  (1.676 m), weight 75.2 kg (165 lb 12.8 oz), last menstrual period 07/14/2017, SpO2 100 %, unknown if currently breastfeeding.  Physical Exam:  General: alert and cooperative Lochia: appropriate Uterine Fundus: firm DVT Evaluation: No significant calf/ankle edema.  Recent Labs    04/12/18 1905 04/13/18 0126  HGB 11.4* 10.8*  HCT 32.9* 31.1*    Assessment/Plan: Plan for discharge tomorrow, Breastfeeding and Contraception Nexplanon   LOS: 2 days   Margarita RanaHarrison D Laketa Sandoz 04/14/2018, 7:04 AM

## 2018-04-14 NOTE — Discharge Instructions (Signed)
Postpartum Care After Vaginal Delivery °The period of time right after you deliver your newborn is called the postpartum period. °What kind of medical care will I receive? °· You may continue to receive fluids and medicines through an IV tube inserted into one of your veins. °· If an incision was made near your vagina (episiotomy) or if you had some vaginal tearing during delivery, cold compresses may be placed on your episiotomy or your tear. This helps to reduce pain and swelling. °· You may be given a squirt bottle to use when you go to the bathroom. You may use this until you are comfortable wiping as usual. To use the squirt bottle, follow these steps: °? Before you urinate, fill the squirt bottle with warm water. Do not use hot water. °? After you urinate, while you are sitting on the toilet, use the squirt bottle to rinse the area around your urethra and vaginal opening. This rinses away any urine and blood. °? You may do this instead of wiping. As you start healing, you may use the squirt bottle before wiping yourself. Make sure to wipe gently. °? Fill the squirt bottle with clean water every time you use the bathroom. °· You will be given sanitary pads to wear. °How can I expect to feel? °· You may not feel the need to urinate for several hours after delivery. °· You will have some soreness and pain in your abdomen and vagina. °· If you are breastfeeding, you may have uterine contractions every time you breastfeed for up to several weeks postpartum. Uterine contractions help your uterus return to its normal size. °· It is normal to have vaginal bleeding (lochia) after delivery. The amount and appearance of lochia is often similar to a menstrual period in the first week after delivery. It will gradually decrease over the next few weeks to a dry, yellow-brown discharge. For most women, lochia stops completely by 6-8 weeks after delivery. Vaginal bleeding can vary from woman to woman. °· Within the first few  days after delivery, you may have breast engorgement. This is when your breasts feel heavy, full, and uncomfortable. Your breasts may also throb and feel hard, tightly stretched, warm, and tender. After this occurs, you may have milk leaking from your breasts. Your health care provider can help you relieve discomfort due to breast engorgement. Breast engorgement should go away within a few days. °· You may feel more sad or worried than normal due to hormonal changes after delivery. These feelings should not last more than a few days. If these feelings do not go away after several days, speak with your health care provider. °How should I care for myself? °· Tell your health care provider if you have pain or discomfort. °· Drink enough water to keep your urine clear or pale yellow. °· Wash your hands thoroughly with soap and water for at least 20 seconds after changing your sanitary pads, after using the toilet, and before holding or feeding your baby. °· If you are not breastfeeding, avoid touching your breasts a lot. Doing this can make your breasts produce more milk. °· If you become weak or lightheaded, or you feel like you might faint, ask for help before: °? Getting out of bed. °? Showering. °· Change your sanitary pads frequently. Watch for any changes in your flow, such as a sudden increase in volume, a change in color, the passing of large blood clots. If you pass a blood clot from your vagina, save it   to show to your health care provider. Do not flush blood clots down the toilet without having your health care provider look at them. °· Make sure that all your vaccinations are up to date. This can help protect you and your baby from getting certain diseases. You may need to have immunizations done before you leave the hospital. °· If desired, talk with your health care provider about methods of family planning or birth control (contraception). °How can I start bonding with my baby? °Spending as much time as  possible with your baby is very important. During this time, you and your baby can get to know each other and develop a bond. Having your baby stay with you in your room (rooming in) can give you time to get to know your baby. Rooming in can also help you become comfortable caring for your baby. Breastfeeding can also help you bond with your baby. °How can I plan for returning home with my baby? °· Make sure that you have a car seat installed in your vehicle. °? Your car seat should be checked by a certified car seat installer to make sure that it is installed safely. °? Make sure that your baby fits into the car seat safely. °· Ask your health care provider any questions you have about caring for yourself or your baby. Make sure that you are able to contact your health care provider with any questions after leaving the hospital. °This information is not intended to replace advice given to you by your health care provider. Make sure you discuss any questions you have with your health care provider. °Document Released: 06/23/2007 Document Revised: 01/29/2016 Document Reviewed: 07/31/2015 °[employer]vier Interactive Patient Education © 2018 [employer] ° °

## 2018-04-14 NOTE — Lactation Note (Signed)
This note was copied from a baby's chart. Lactation Consultation Note  Patient Name: Bethany Cruz XBJYN'WToday's Date: 04/14/2018 Reason for consult: Follow-up assessment;Term  P3 mother whose infant is now 2429 hours old.  Mother breastfed her 453 and 23 year old for 2 years each.  Mother was breastfeeding in the side lying position on the left side as I entered.  She stated breastfeeding is going very well and she has no questions or concerns.  There is no pain with latching and baby is swallowing and satisfied after feedings.  Engorgement prevention/treatment discussed.Mother stays home with her children and does not plan to use her DEBP much at all but has one if needed.  Manual pump provided with instructions for use.  Instructed mother to call OP phone number if questions/concerns arise after discharge.     Maternal Data Formula Feeding for Exclusion: No Has patient been taught Hand Expression?: Yes Does the patient have breastfeeding experience prior to this delivery?: Yes  Feeding Feeding Type: Breast Fed Length of feed: 5 min(feeding before I entered and still feeding when I left the room)  LATCH Score Latch: Grasps breast easily, tongue down, lips flanged, rhythmical sucking.  Audible Swallowing: Spontaneous and intermittent  Type of Nipple: Everted at rest and after stimulation  Comfort (Breast/Nipple): Soft / non-tender  Hold (Positioning): No assistance needed to correctly position infant at breast.  LATCH Score: 10  Interventions Interventions: Breast feeding basics reviewed  Lactation Tools Discussed/Used     Consult Status Consult Status: Complete Date: 04/14/18 Follow-up type: Call as needed    Coren Crownover R Johnn Krasowski 04/14/2018, 9:09 AM

## 2018-04-15 ENCOUNTER — Encounter: Payer: Self-pay | Admitting: Certified Nurse Midwife

## 2018-04-15 DIAGNOSIS — O411290 Chorioamnionitis, unspecified trimester, not applicable or unspecified: Secondary | ICD-10-CM | POA: Insufficient documentation

## 2018-04-20 ENCOUNTER — Ambulatory Visit (INDEPENDENT_AMBULATORY_CARE_PROVIDER_SITE_OTHER): Payer: Medicaid Other

## 2018-04-20 VITALS — BP 123/80 | HR 82 | Wt 151.6 lb

## 2018-04-20 DIAGNOSIS — O10919 Unspecified pre-existing hypertension complicating pregnancy, unspecified trimester: Secondary | ICD-10-CM

## 2018-04-20 NOTE — Progress Notes (Signed)
Subjective:  Bethany KohutSelene Cruz is a 22 y.o. female here for BP check.   Hypertension ROS: taking medications as instructed, no medication side effects noted, no TIA's, no chest pain on exertion, no dyspnea on exertion and no swelling of ankles.    Objective:  BP 123/80   Pulse 82   Wt 151 lb 9.6 oz (68.8 kg)   Breastfeeding? Yes   BMI 24.47 kg/m   Appearance alert, well appearing, and in no distress. General exam BP noted to be well controlled today in office.    Assessment:   Blood Pressure well controlled.   Plan:  Current treatment plan is effective, no change in therapy.Keep PP appointment.

## 2018-05-14 ENCOUNTER — Ambulatory Visit: Payer: Medicaid Other | Admitting: Obstetrics

## 2018-05-14 ENCOUNTER — Ambulatory Visit: Payer: Medicaid Other | Admitting: Obstetrics and Gynecology

## 2018-06-01 ENCOUNTER — Ambulatory Visit: Payer: Medicaid Other | Admitting: Obstetrics & Gynecology

## 2018-06-09 ENCOUNTER — Ambulatory Visit (INDEPENDENT_AMBULATORY_CARE_PROVIDER_SITE_OTHER): Payer: Medicaid Other | Admitting: Obstetrics and Gynecology

## 2018-06-09 ENCOUNTER — Encounter: Payer: Self-pay | Admitting: Obstetrics and Gynecology

## 2018-06-09 DIAGNOSIS — Z3046 Encounter for surveillance of implantable subdermal contraceptive: Secondary | ICD-10-CM | POA: Diagnosis not present

## 2018-06-09 DIAGNOSIS — Z30017 Encounter for initial prescription of implantable subdermal contraceptive: Secondary | ICD-10-CM

## 2018-06-09 DIAGNOSIS — Z3202 Encounter for pregnancy test, result negative: Secondary | ICD-10-CM | POA: Diagnosis not present

## 2018-06-09 DIAGNOSIS — Z1389 Encounter for screening for other disorder: Secondary | ICD-10-CM

## 2018-06-09 LAB — POCT URINE PREGNANCY: PREG TEST UR: NEGATIVE

## 2018-06-09 MED ORDER — ETONOGESTREL 68 MG ~~LOC~~ IMPL
68.0000 mg | DRUG_IMPLANT | Freq: Once | SUBCUTANEOUS | Status: AC
  Administered 2018-06-09: 68 mg via SUBCUTANEOUS

## 2018-06-09 MED ORDER — DOCUSATE SODIUM 100 MG PO CAPS: 100.0000 mg | ORAL_CAPSULE | Freq: Two times a day (BID) | ORAL | 2 refills | Status: AC | PRN

## 2018-06-09 NOTE — Progress Notes (Signed)
Post Partum Exam  Bethany Cruz is a 22 y.o. [id number] female who presents for a postpartum visit. She is 7 weeks postpartum following a spontaneous vaginal delivery. I have fully reviewed the prenatal and intrapartum course. The delivery was at 39 gestational weeks.  Anesthesia: epidural. Postpartum course has been unremarkable. Baby's course has been unremarkable. Baby is feeding by breast. Bleeding staining only. Bowel function is abnormal: painful bowel movements. Bladder function is normal. Patient is sexually active. Contraception method is nexplanon. Postpartum depression screening:neg 0    Last pap smear done 12/11/17 and was Normal  Review of Systems Pertinent items are noted in HPI.    Objective:  Blood pressure 119/76, pulse (!) 58, weight 146 lb (66.2 kg), currently breastfeeding.  General:  alert, cooperative and no distress   Breasts:  inspection negative, no nipple discharge or bleeding, no masses or nodularity palpable  Lungs: clear to auscultation bilaterally  Heart:  regular rate and rhythm, S1, S2 normal, no murmur, click, rub or gallop  Abdomen: soft, non-tender; bowel sounds normal; no masses,  no organomegaly   Vulva:  normal  Vagina: normal vagina, no discharge, exudate, lesion, or erythema  Cervix:  multiparous appearance  Corpus: normal size, contour, position, consistency, mobility, non-tender  Adnexa:  normal adnexa and no mass, fullness, tenderness  Rectal Exam: Not performed.        Assessment:    Normal postpartum exam. Pap smear not done at today's visit.   Plan:   1. Contraception: Nexplanon  Patient given informed consent, signed copy in the chart, time out was performed. Pregnancy test was negative. Appropriate time out taken.  Patient's right arm was prepped and draped in the usual sterile fashion. The ruler used to measure and mark insertion area.  Patient was prepped with alcohol swab and then injected with 2 cc of 1% lidocaine with epinephrine.   Patient was prepped with betadine, Nexplanon removed form packaging.  Device confirmed in needle, then inserted full length of needle and withdrawn per handbook instructions.  Patient insertion site covered with band-aid and pressure dressing.   Minimal blood loss.  Patient tolerated the procedure well.  2. Patient is medically cleared to resume all activities of daily living 3. Follow up in: 6 months for annual exam or as needed.

## 2018-10-06 ENCOUNTER — Telehealth (INDEPENDENT_AMBULATORY_CARE_PROVIDER_SITE_OTHER): Payer: Self-pay

## 2018-10-06 NOTE — Telephone Encounter (Signed)
Called and left a VM for patient to return my call . 

## 2020-03-05 IMAGING — US US MFM FETAL BPP W/O NON-STRESS
1 series · 12 of 28 positions shown · non-contrast
Comparison: none

[Series 1: us mfm fetal bpp w/o non-stress · 28 acquisitions, 12 frames shown]
[im 2/28]
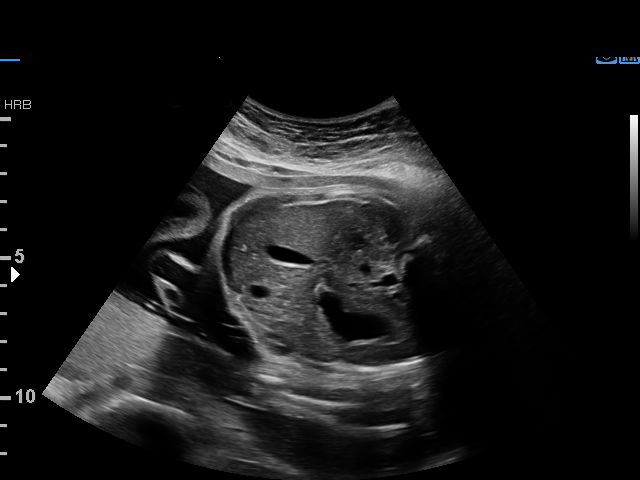
[im 4/28]
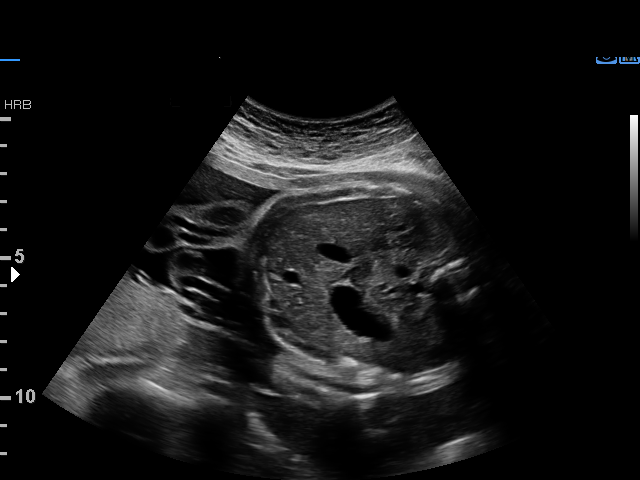
[im 6/28]
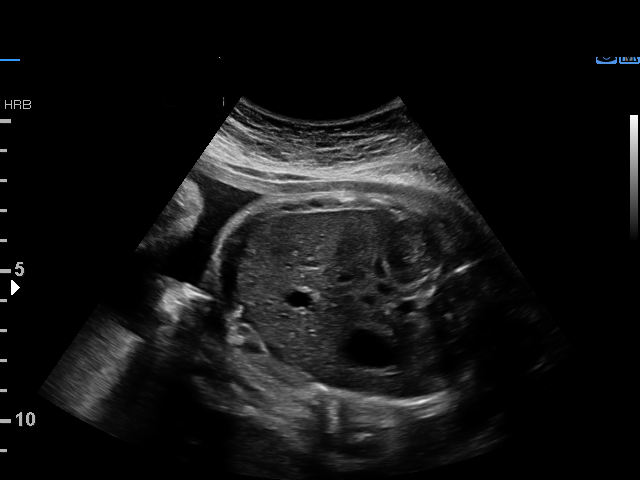
[im 9/28]
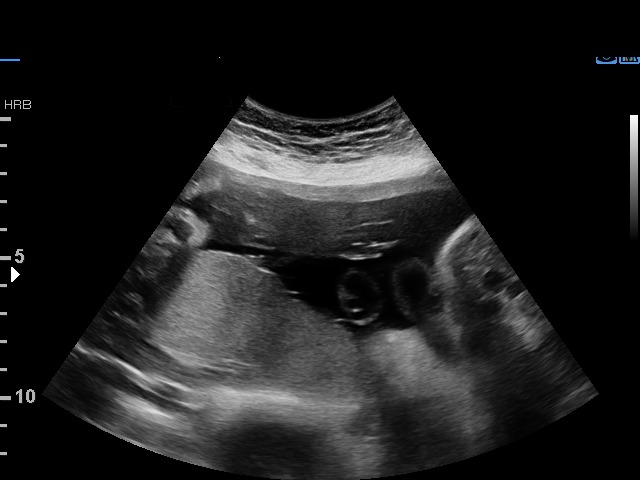
[im 11/28]
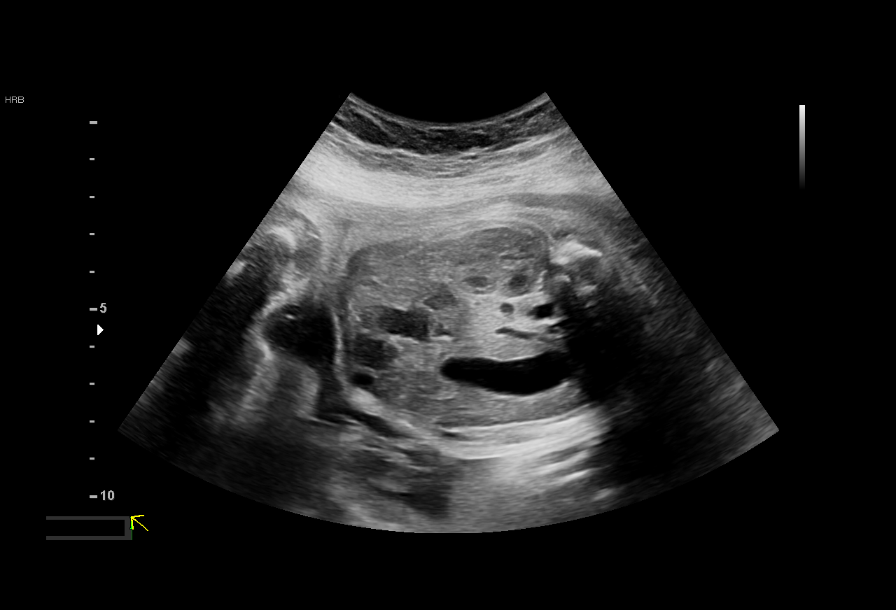
[im 13/28]
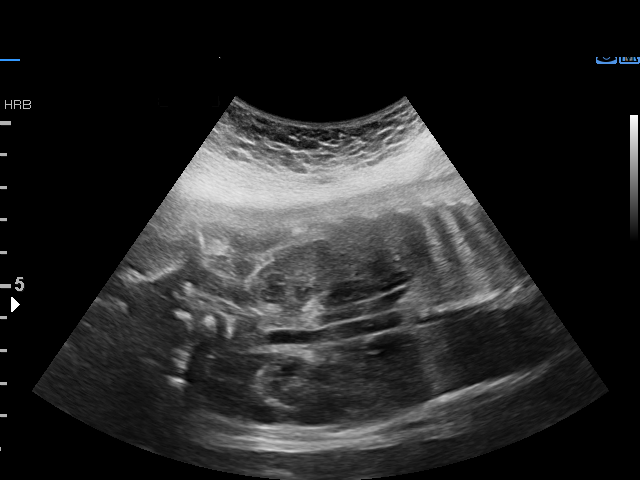
[im 16/28]
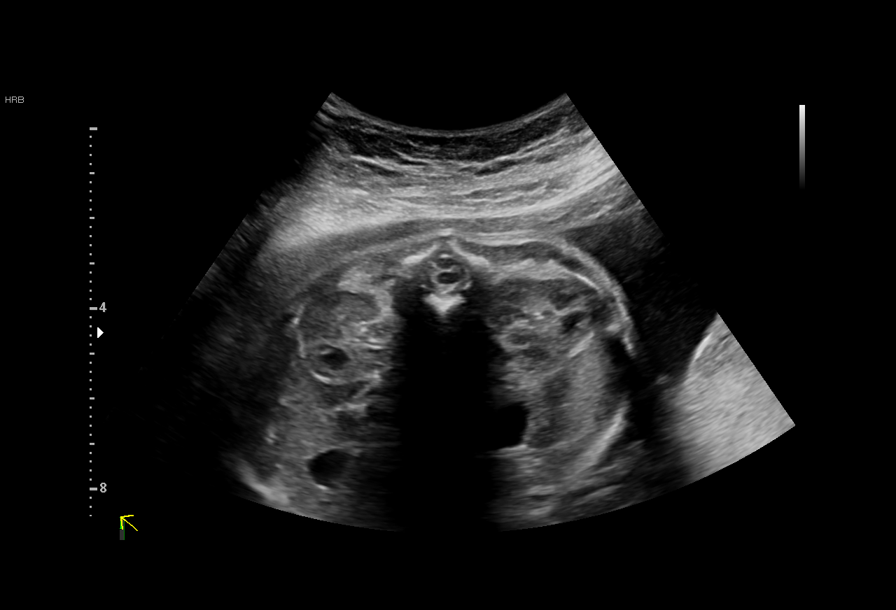
[im 18/28]
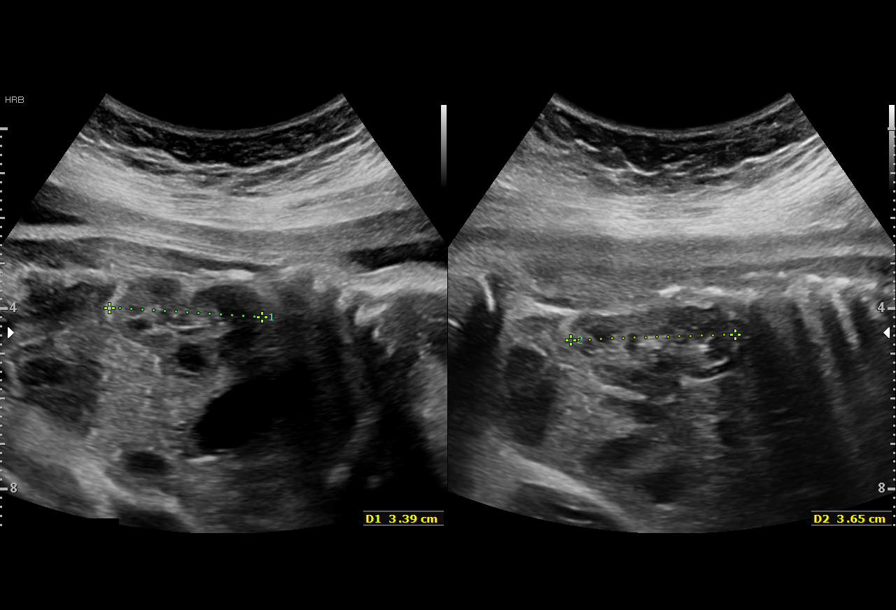
[im 20/28]
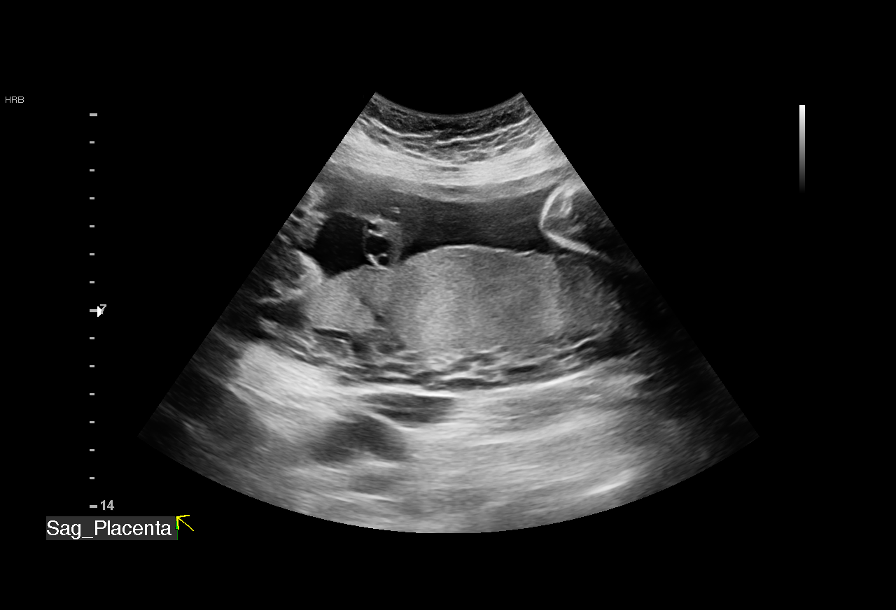
[im 23/28]
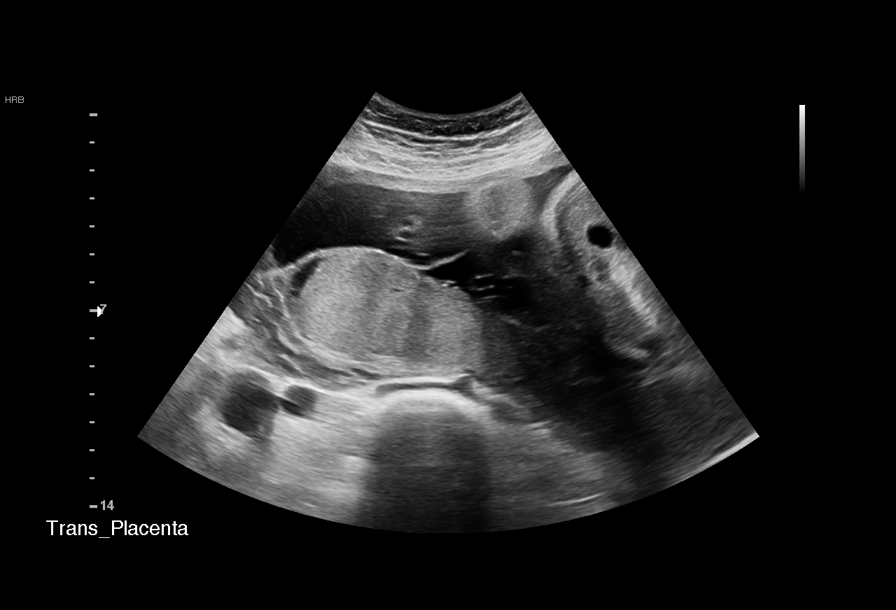
[im 25/28]
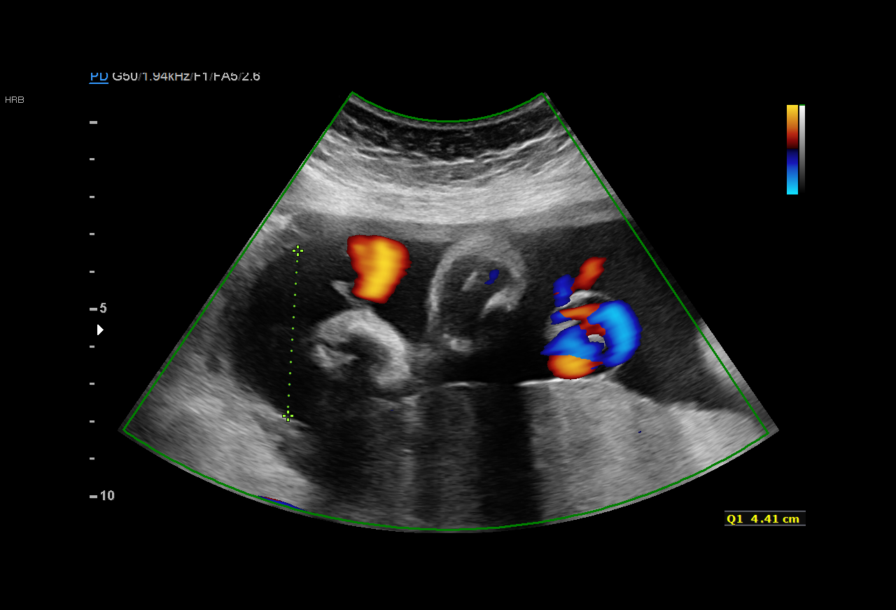
[im 27/28]
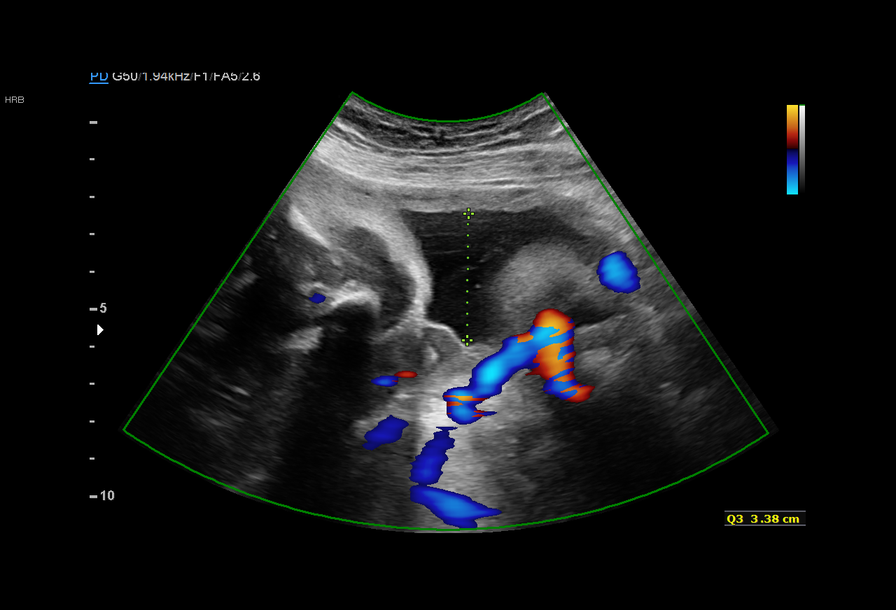

[12 of 28 positions shown; findings below may reference images not displayed]

1  POPKO RESOORT           [id number]      [id number]     [id number]
Indications

31 weeks gestation of pregnancy
Poor obstetric history: Previous
preeclampsia / eclampsia/gestational HTN
OB History

Blood Type:            Height:  5'5"   Weight (lb):  145       BMI:
Gravidity:    3         Term:   2
Living:       2
Fetal Evaluation

Num Of Fetuses:     1
Fetal Heart         142
Rate(bpm):
Cardiac Activity:   Observed
Presentation:       Cephalic
Placenta:           Posterior, above cervical os
P. Cord Insertion:  Previously Visualized

Amniotic Fluid
AFI FV:      Subjectively within normal limits

AFI Sum(cm)     %Tile       Largest Pocket(cm)
14.31           49

RUQ(cm)       RLQ(cm)       LUQ(cm)        LLQ(cm)
4.41
Biophysical Evaluation
Amniotic F.V:   Within normal limits       F. Tone:        Observed
F. Movement:    Observed                   Score:          [DATE]
F. Breathing:   Observed
Gestational Age

Best:          31w 1d     Det. By:  [facility]         EDD:   04/20/18
Anatomy

Thoracic:              Appears normal         Kidneys:                Appear normal
Stomach:               Appears normal, left   Bladder:                Appears normal
sided
Abdomen:               Appears normal
Cervix Uterus Adnexa

Cervix
Not visualized (advanced GA >73wks)
Impression

Single living intrauterine pregnancy at 31w 1d.
Cephalic presentation.
Normal amniotic fluid volume.
BPP [DATE].
Recommendations

Follow-up ultrasounds as clinically indicated.

## 2020-03-11 IMAGING — US US MFM FETAL BPP W/O NON-STRESS
1 series · 14 of 28 positions shown · non-contrast
Comparison: none

[Series 1: us mfm fetal bpp w/o non-stress · 30 acquisitions, 14 frames shown]
[im 2/30]
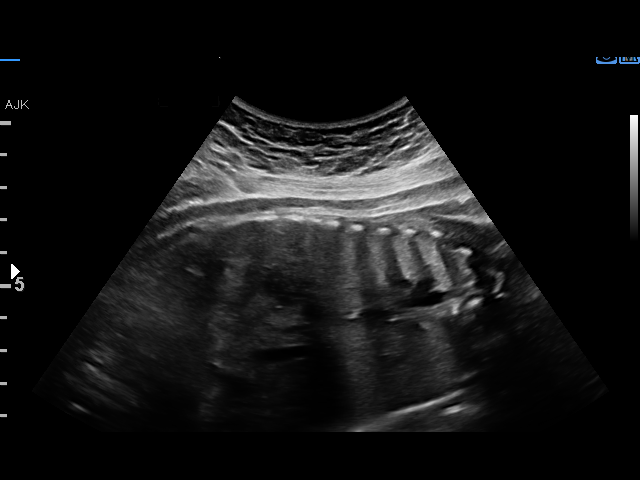
[im 4/30]
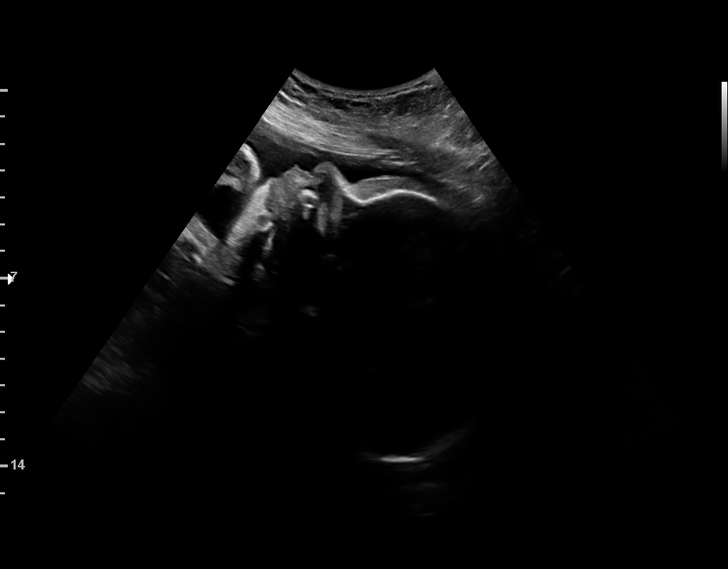
[im 6/30]
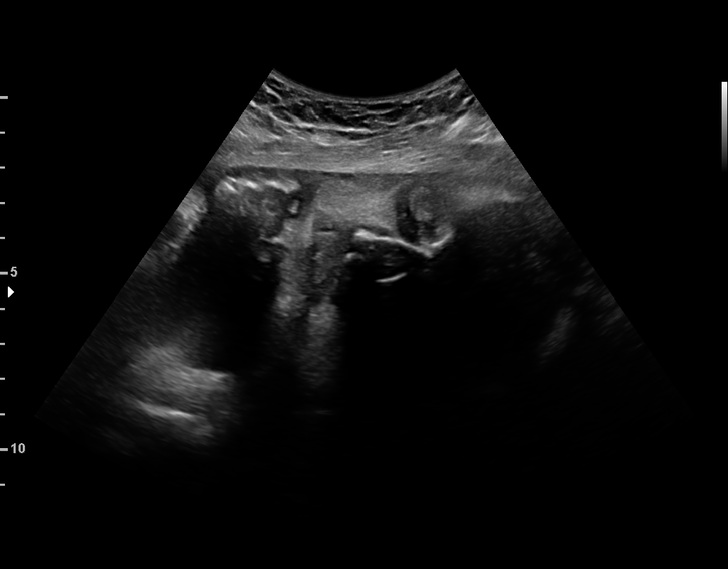
[im 8/30]
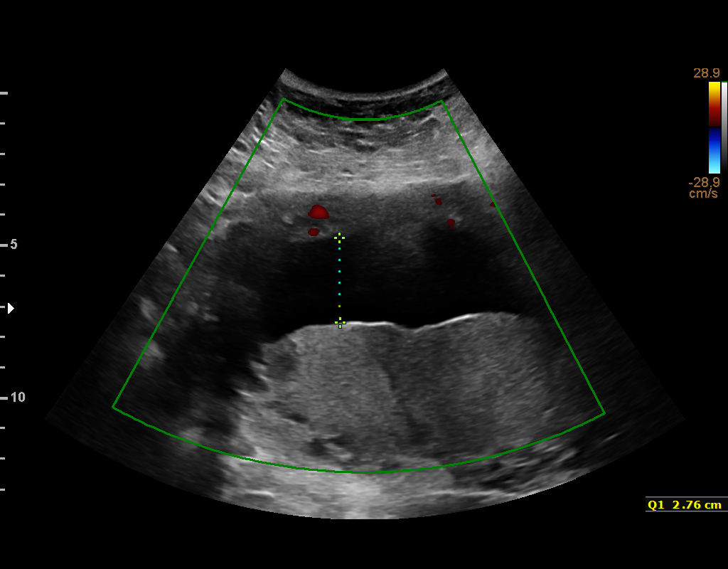
[im 10/30]
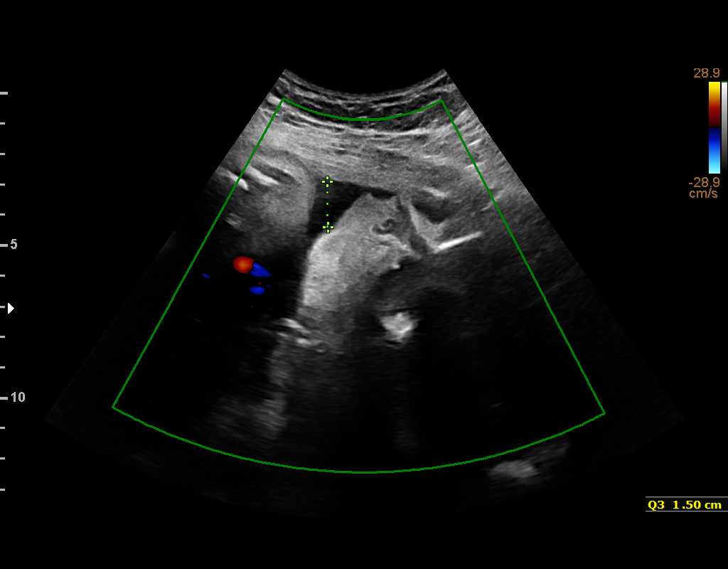
[im 12/30]
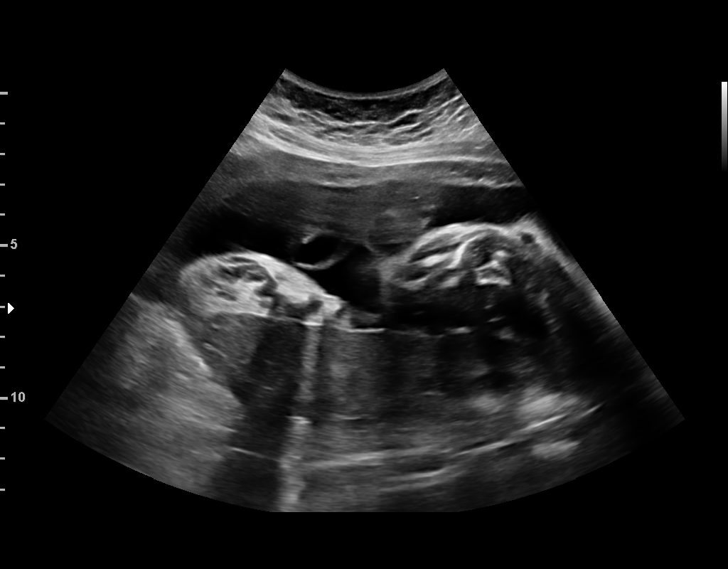
[im 14/30]
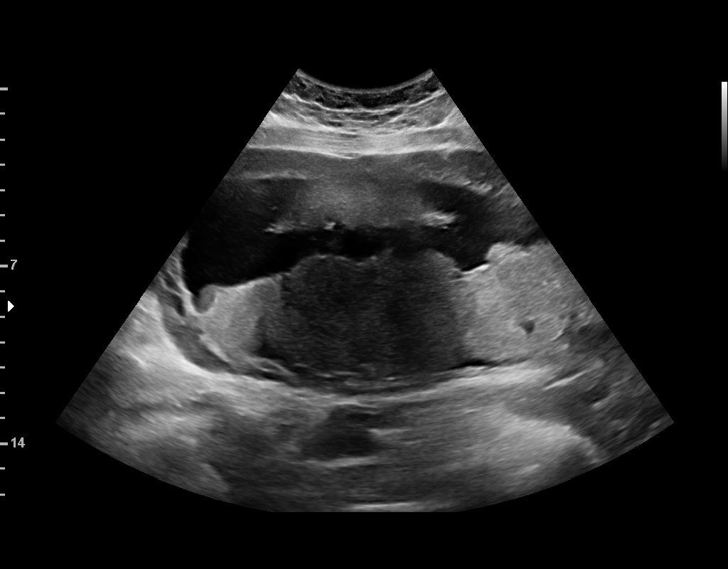
[im 17/30]
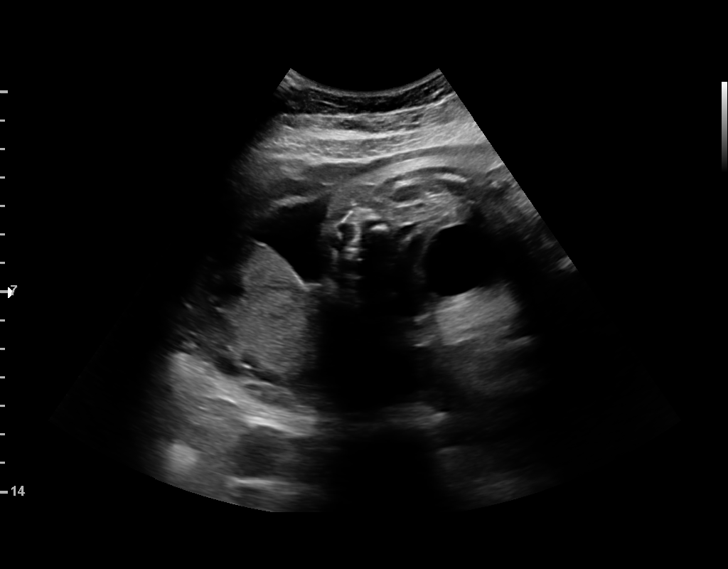
[im 19/30]
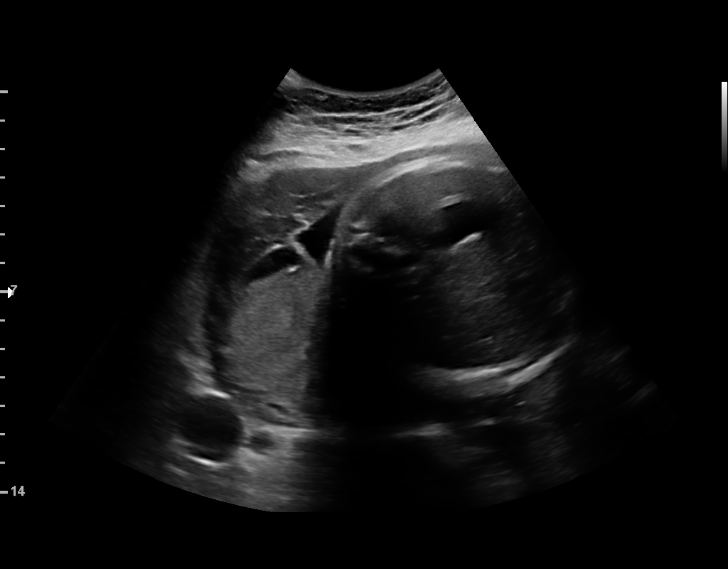
[im 21/30]
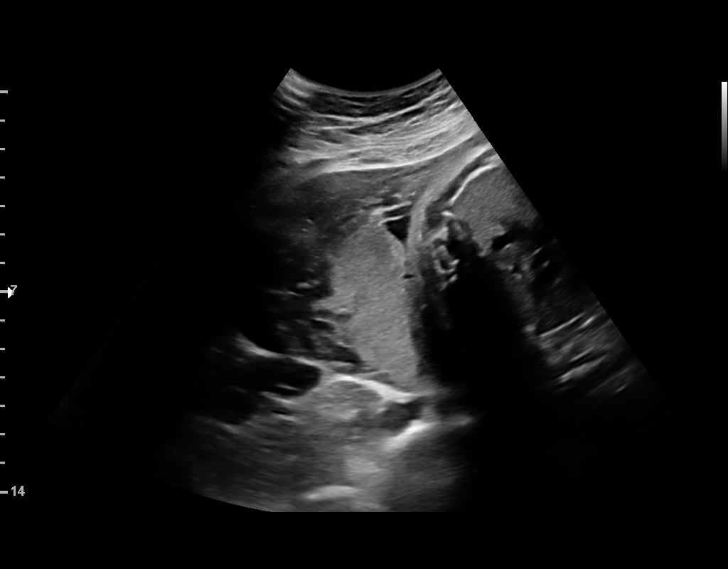
[im 23/30]
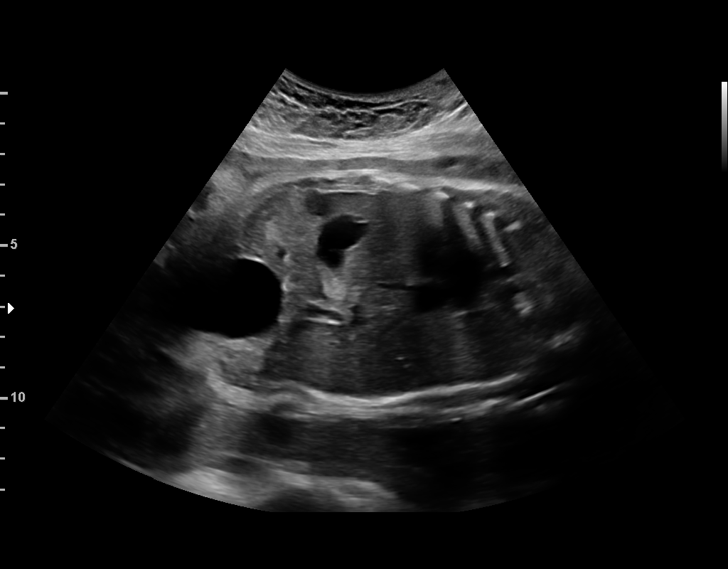
[im 25/30]
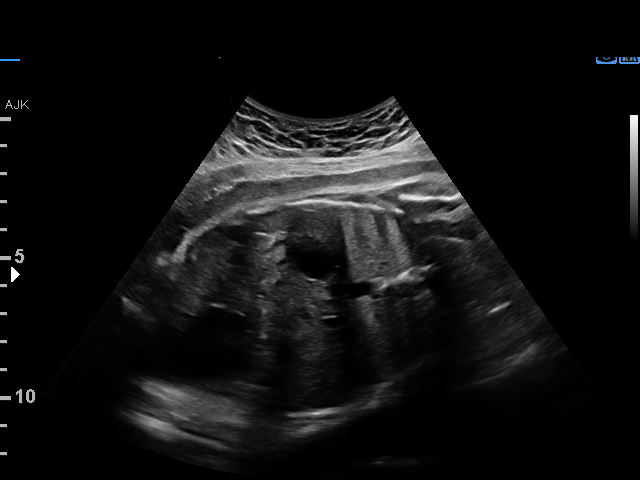
[im 27/30]
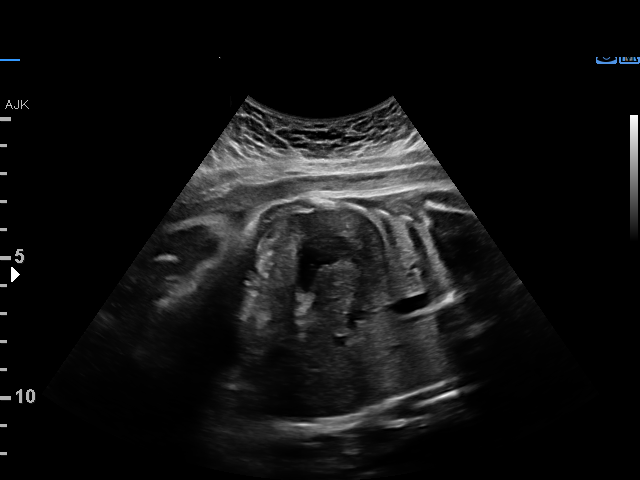
[im 30/30]
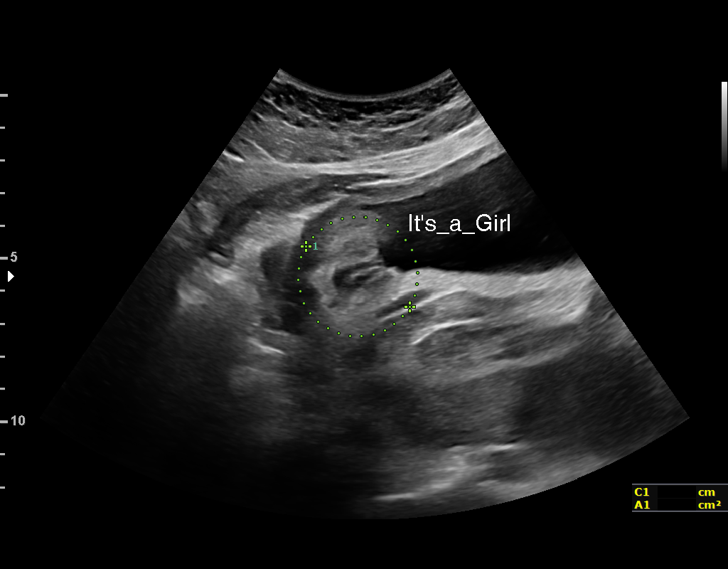

[14 of 28 positions shown; findings below may reference images not displayed]

1  GIORGI JUMPER           [id number]      [id number]     [id number]
Indications

32 weeks gestation of pregnancy
Poor obstetric history: Previous
preeclampsia / eclampsia/gestational HTN
Hypertension - Chronic/Pre-existing
OB History

Blood Type:            Height:  5'5"   Weight (lb):  145       BMI:
Gravidity:    3         Term:   2
Living:       2
Fetal Evaluation

Num Of Fetuses:     1
Fetal Heart         138
Rate(bpm):
Cardiac Activity:   Observed
Presentation:       Cephalic
Placenta:           Posterior, above cervical os
P. Cord Insertion:  Previously Visualized

Amniotic Fluid
AFI FV:      Subjectively within normal limits

AFI Sum(cm)     %Tile       Largest Pocket(cm)
13.06           40

RUQ(cm)       RLQ(cm)       LUQ(cm)        LLQ(cm)
2.76
Biophysical Evaluation
Amniotic F.V:   Within normal limits       F. Tone:        Observed
F. Movement:    Observed                   Score:          [DATE]
F. Breathing:   Observed
Gestational Age

Best:          32w 0d     Det. By:  [facility]         EDD:   04/20/18
Impression

Intrauterine pregnancy at 32+0 weeks with CHTN
Normal amniotic fluid
BPP [DATE]
Recommendations

Continue antepartum fetal testing thruogh delivery

## 2020-03-15 IMAGING — US US MFM FETAL BPP W/O NON-STRESS
1 series · 16 of 28 positions shown · non-contrast
Comparison: none

[Series 1: us mfm fetal bpp w/o non-stress · 37 acquisitions, 16 frames shown]
[im 1/37]
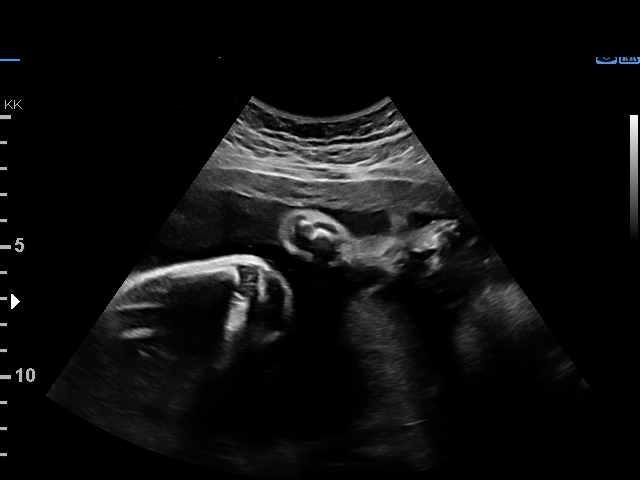
[im 3/37]
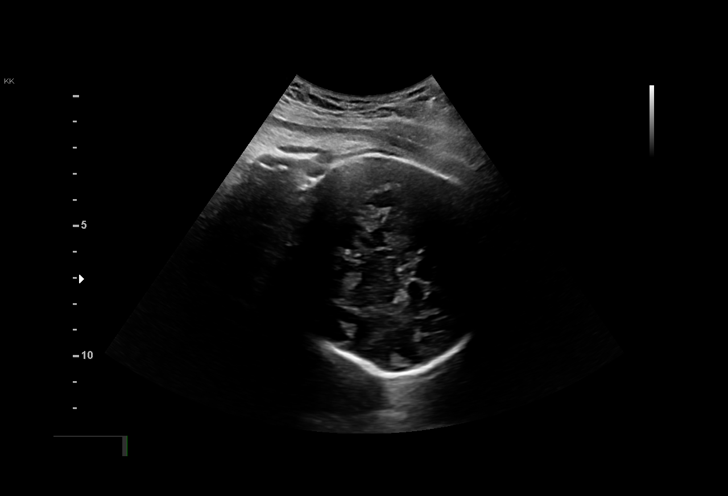
[im 6/37]
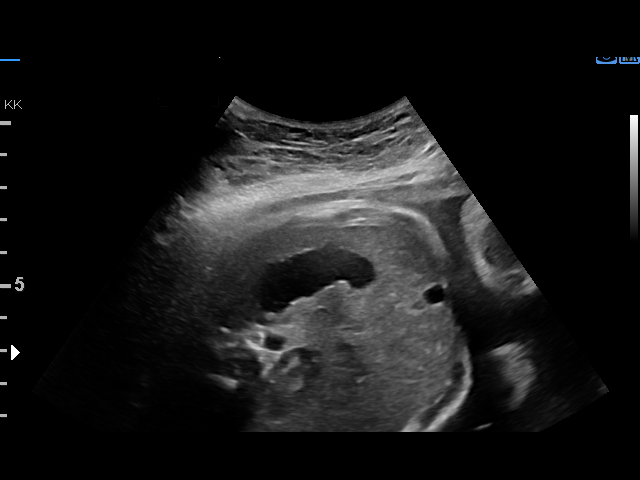
[im 9/37]
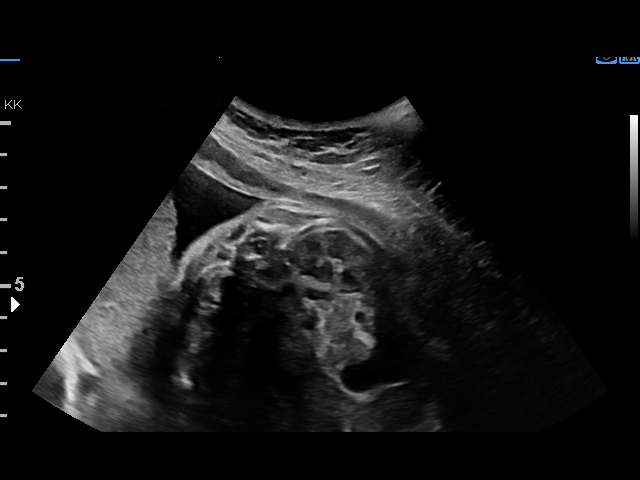
[im 10/37]
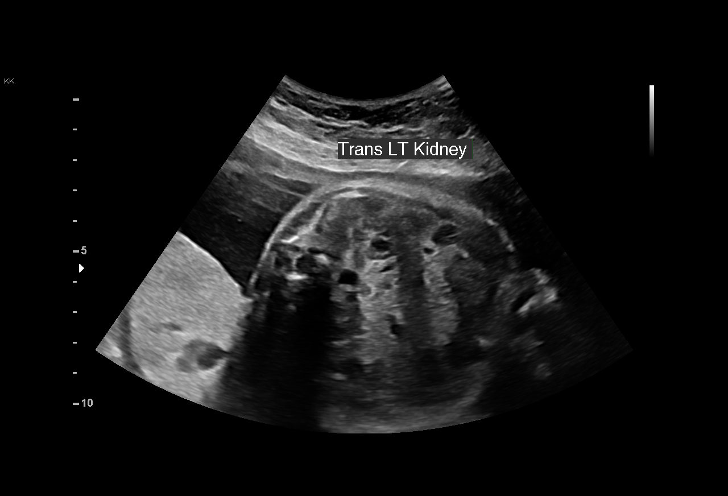
[im 13/37]
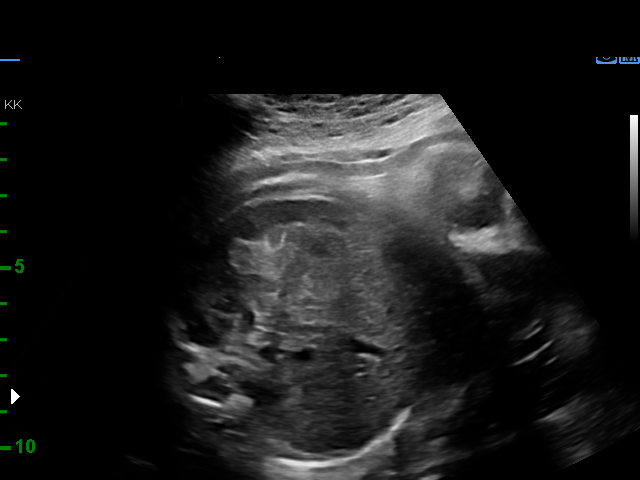
[im 15/37]
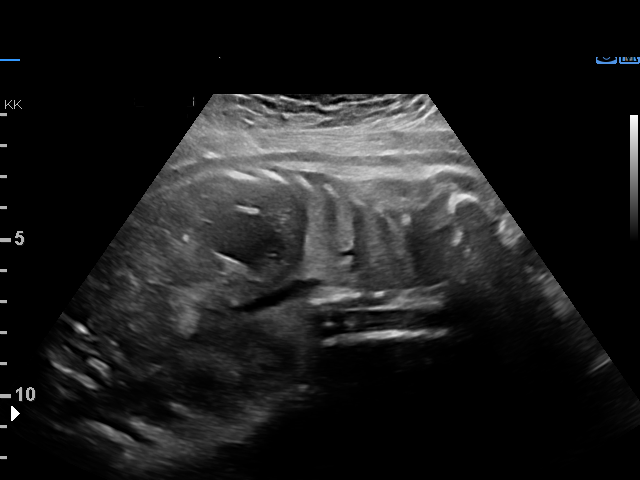
[im 18/37]
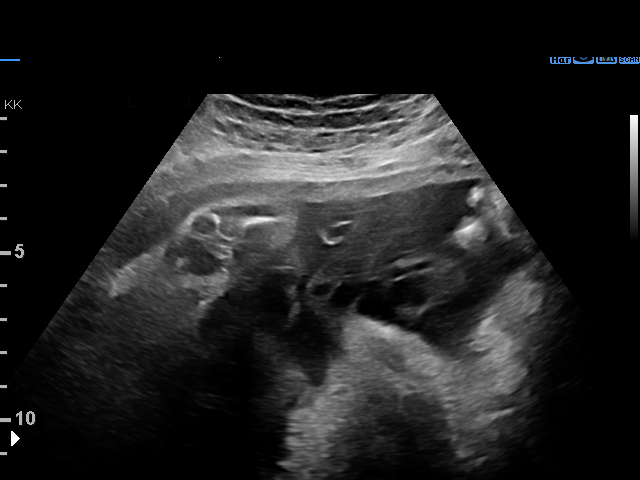
[im 19/37]
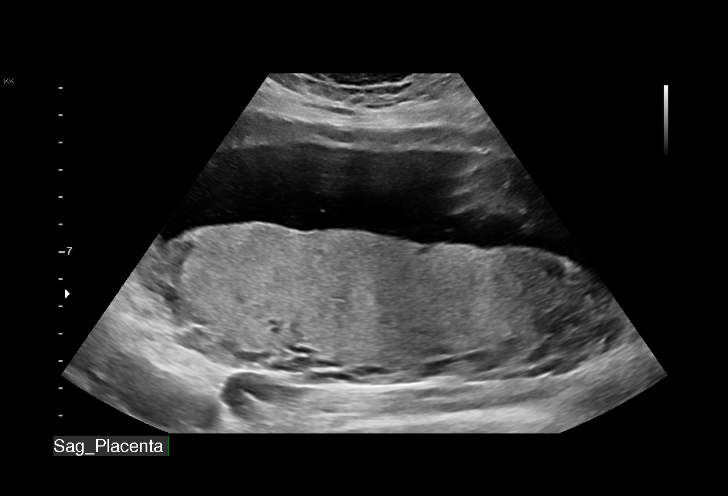
[im 22/37]
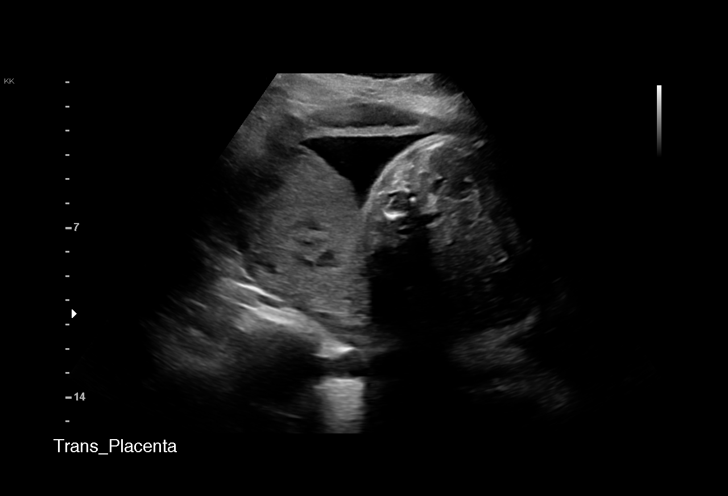
[im 25/37]
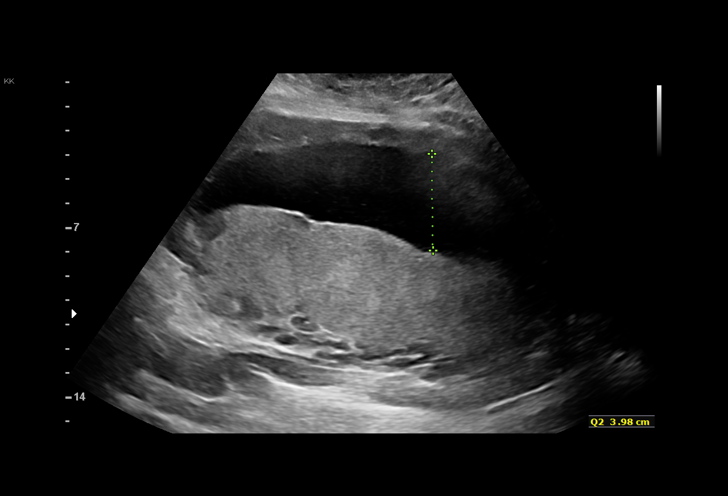
[im 27/37]
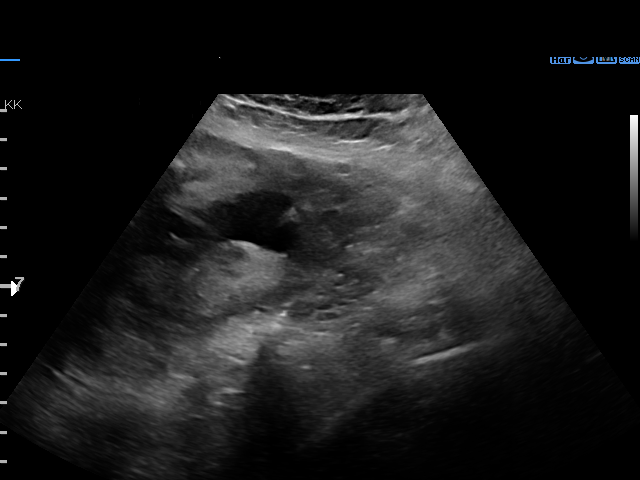
[im 29/37]
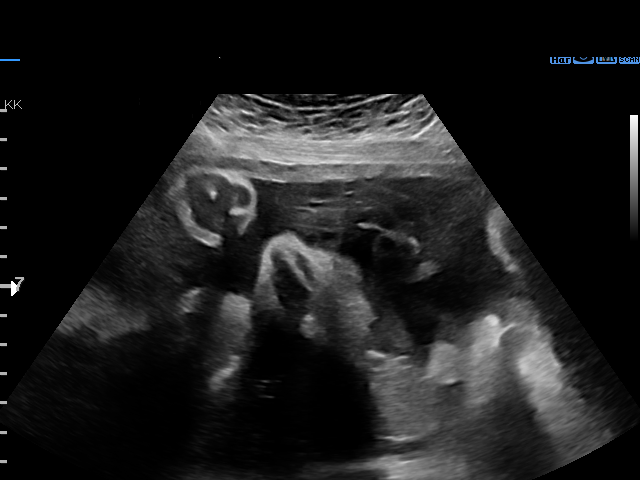
[im 31/37]
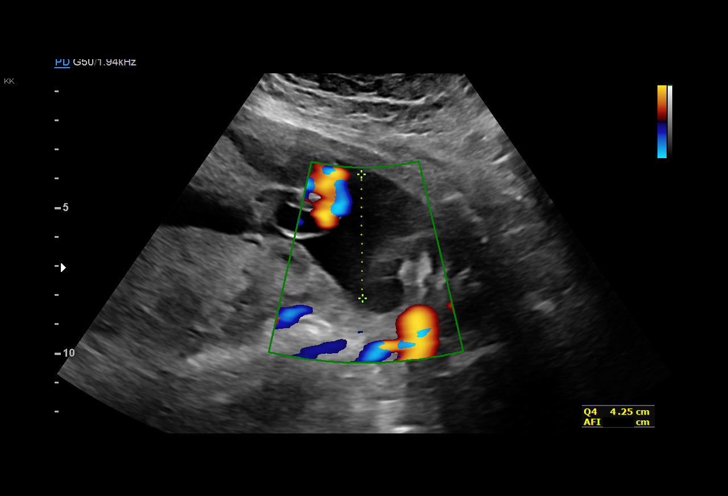
[im 34/37]
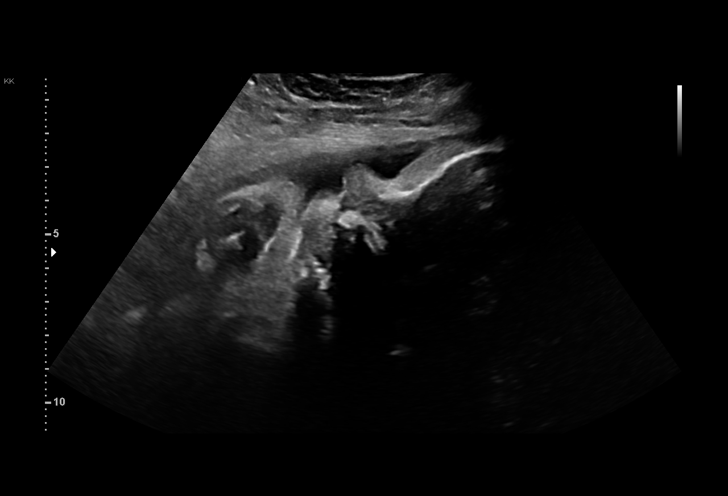
[im 37/37]
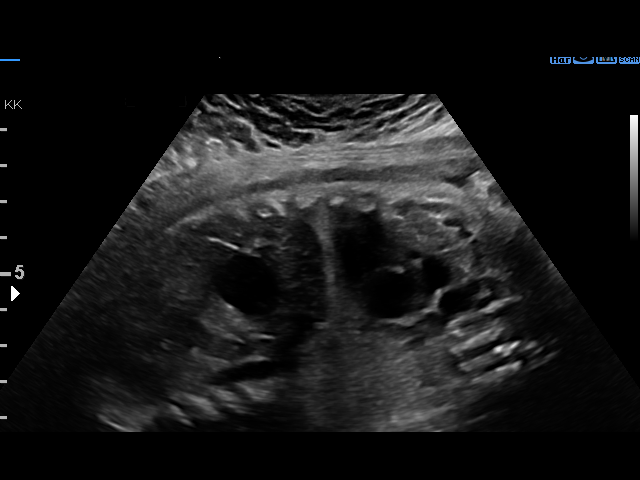

[16 of 28 positions shown; findings below may reference images not displayed]

1  AAKASH PENDLEY           [id number]      [id number]     [id number]
Indications

32 weeks gestation of pregnancy
Poor obstetric history: Previous
preeclampsia / eclampsia/gestational HTN
Hypertension - Chronic/Pre-existing
Non-reactive NST in office today
OB History

Blood Type:            Height:  5'5"   Weight (lb):  145       BMI:
Gravidity:    3         Term:   2
Living:       2
Fetal Evaluation

Num Of Fetuses:     1
Fetal Heart         144
Rate(bpm):
Cardiac Activity:   Observed
Presentation:       Cephalic

Amniotic Fluid
AFI FV:      Subjectively within normal limits

AFI Sum(cm)     %Tile       Largest Pocket(cm)
15.3            54

RUQ(cm)       RLQ(cm)       LUQ(cm)        LLQ(cm)
3.49
Biophysical Evaluation
Amniotic F.V:   Pocket => 2 cm two         F. Tone:         Observed
planes
F. Movement:    Observed                   Score:           [DATE]
F. Breathing:   Observed
Gestational Age

Best:          32w 4d     Det. By:  [facility]         EDD:    04/20/18
Impression

SIUP at 32+4 weeks
Normal amniotic fluid volume
BPP [DATE]
Recommendations

Continue antenatal testing
